# Patient Record
Sex: Female | Born: 1945 | Race: White | Hispanic: No | Smoking: Former smoker
Health system: Southern US, Community
[De-identification: ages and names within clinical notes are randomized; demographics above are authoritative.]

## PROBLEM LIST (undated history)

## (undated) DIAGNOSIS — I219 Acute myocardial infarction, unspecified: Secondary | ICD-10-CM

## (undated) DIAGNOSIS — C801 Malignant (primary) neoplasm, unspecified: Secondary | ICD-10-CM

## (undated) DIAGNOSIS — K219 Gastro-esophageal reflux disease without esophagitis: Secondary | ICD-10-CM

## (undated) DIAGNOSIS — E039 Hypothyroidism, unspecified: Secondary | ICD-10-CM

## (undated) DIAGNOSIS — E785 Hyperlipidemia, unspecified: Secondary | ICD-10-CM

## (undated) DIAGNOSIS — I10 Essential (primary) hypertension: Secondary | ICD-10-CM

## (undated) HISTORY — PX: CORONARY ANGIOPLASTY WITH STENT PLACEMENT: SHX49

## (undated) HISTORY — PX: ABDOMINAL HYSTERECTOMY: SHX81

---

## 2004-11-11 ENCOUNTER — Ambulatory Visit: Payer: Self-pay | Admitting: Family Medicine

## 2005-12-27 ENCOUNTER — Ambulatory Visit: Payer: Self-pay | Admitting: Family Medicine

## 2006-04-10 ENCOUNTER — Emergency Department: Payer: Self-pay | Admitting: General Practice

## 2006-08-04 ENCOUNTER — Ambulatory Visit: Payer: Self-pay | Admitting: Gastroenterology

## 2007-03-13 ENCOUNTER — Ambulatory Visit: Payer: Self-pay | Admitting: Family Medicine

## 2008-04-03 ENCOUNTER — Ambulatory Visit: Payer: Self-pay | Admitting: Family Medicine

## 2010-05-12 ENCOUNTER — Ambulatory Visit: Payer: Self-pay | Admitting: Family Medicine

## 2010-06-07 ENCOUNTER — Emergency Department: Payer: Self-pay | Admitting: Emergency Medicine

## 2010-06-10 ENCOUNTER — Emergency Department: Payer: Self-pay | Admitting: Emergency Medicine

## 2011-05-18 ENCOUNTER — Ambulatory Visit: Payer: Self-pay | Admitting: Family Medicine

## 2011-09-13 ENCOUNTER — Ambulatory Visit: Payer: Self-pay | Admitting: Gastroenterology

## 2012-08-15 ENCOUNTER — Ambulatory Visit: Payer: Self-pay | Admitting: Family Medicine

## 2013-08-16 ENCOUNTER — Ambulatory Visit: Payer: Self-pay | Admitting: Family Medicine

## 2013-08-23 ENCOUNTER — Ambulatory Visit: Payer: Self-pay | Admitting: Family Medicine

## 2014-09-10 ENCOUNTER — Ambulatory Visit: Payer: Self-pay | Admitting: Family Medicine

## 2015-07-28 ENCOUNTER — Encounter: Payer: Self-pay | Admitting: Emergency Medicine

## 2015-07-28 ENCOUNTER — Emergency Department: Payer: Medicare Other

## 2015-07-28 ENCOUNTER — Other Ambulatory Visit: Payer: Self-pay

## 2015-07-28 ENCOUNTER — Emergency Department
Admission: EM | Admit: 2015-07-28 | Discharge: 2015-07-28 | Disposition: A | Discharge status: Other Acute Inpatient Hospital | Payer: Medicare Other | Attending: Emergency Medicine | Admitting: Emergency Medicine

## 2015-07-28 DIAGNOSIS — Z87891 Personal history of nicotine dependence: Secondary | ICD-10-CM | POA: Insufficient documentation

## 2015-07-28 DIAGNOSIS — I10 Essential (primary) hypertension: Secondary | ICD-10-CM | POA: Diagnosis not present

## 2015-07-28 DIAGNOSIS — Z79899 Other long term (current) drug therapy: Secondary | ICD-10-CM | POA: Insufficient documentation

## 2015-07-28 DIAGNOSIS — I2119 ST elevation (STEMI) myocardial infarction involving other coronary artery of inferior wall: Secondary | ICD-10-CM

## 2015-07-28 DIAGNOSIS — R079 Chest pain, unspecified: Secondary | ICD-10-CM | POA: Diagnosis present

## 2015-07-28 DIAGNOSIS — I221 Subsequent ST elevation (STEMI) myocardial infarction of inferior wall: Secondary | ICD-10-CM | POA: Diagnosis not present

## 2015-07-28 DIAGNOSIS — I213 ST elevation (STEMI) myocardial infarction of unspecified site: Secondary | ICD-10-CM

## 2015-07-28 LAB — CBC
HCT: 37.4 % (ref 35.0–47.0)
HEMOGLOBIN: 12.1 g/dL (ref 12.0–16.0)
MCH: 27.7 pg (ref 26.0–34.0)
MCHC: 32.5 g/dL (ref 32.0–36.0)
MCV: 85.4 fL (ref 80.0–100.0)
Platelets: 248 10*3/uL (ref 150–440)
RBC: 4.38 MIL/uL (ref 3.80–5.20)
RDW: 14 % (ref 11.5–14.5)
WBC: 7.4 10*3/uL (ref 3.6–11.0)

## 2015-07-28 LAB — COMPREHENSIVE METABOLIC PANEL
ALBUMIN: 3.8 g/dL (ref 3.5–5.0)
ALK PHOS: 67 U/L (ref 38–126)
ALT: 18 U/L (ref 14–54)
ANION GAP: 5 (ref 5–15)
AST: 26 U/L (ref 15–41)
BUN: 26 mg/dL — ABNORMAL HIGH (ref 6–20)
CALCIUM: 8.8 mg/dL — AB (ref 8.9–10.3)
CO2: 25 mmol/L (ref 22–32)
CREATININE: 1.12 mg/dL — AB (ref 0.44–1.00)
Chloride: 109 mmol/L (ref 101–111)
GFR calc Af Amer: 57 mL/min — ABNORMAL LOW (ref >60)
GFR calc non Af Amer: 49 mL/min — ABNORMAL LOW (ref >60)
GLUCOSE: 153 mg/dL — AB (ref 65–99)
Potassium: 3.3 mmol/L — ABNORMAL LOW (ref 3.5–5.1)
SODIUM: 139 mmol/L (ref 135–145)
Total Bilirubin: 0.4 mg/dL (ref 0.3–1.2)
Total Protein: 6.6 g/dL (ref 6.5–8.1)

## 2015-07-28 LAB — TROPONIN I: Troponin I: 0.03 ng/mL (ref <0.031)

## 2015-07-28 MED ORDER — HEPARIN SODIUM (PORCINE) 5000 UNIT/ML IJ SOLN
4000.0000 [IU] | Freq: Once | INTRAMUSCULAR | Status: AC
  Administered 2015-07-28: 4000 [IU] via INTRAVENOUS
  Filled 2015-07-28: qty 1

## 2015-07-28 MED ORDER — CLOPIDOGREL BISULFATE 75 MG PO TABS
600.0000 mg | ORAL_TABLET | Freq: Once | ORAL | Status: AC
  Administered 2015-07-28: 600 mg via ORAL
  Filled 2015-07-28: qty 8

## 2015-07-28 MED ORDER — MORPHINE SULFATE (PF) 2 MG/ML IV SOLN
2.0000 mg | Freq: Once | INTRAVENOUS | Status: AC
  Administered 2015-07-28: 2 mg via INTRAVENOUS
  Filled 2015-07-28: qty 1

## 2015-07-28 MED ORDER — GI COCKTAIL ~~LOC~~
ORAL | Status: AC
  Administered 2015-07-28: 30 mL
  Filled 2015-07-28: qty 30

## 2015-07-28 NOTE — ED Provider Notes (Addendum)
[facility] Emergency Department Provider Note  ____________________________________________  Time seen: Approximately 2312 PM  I have reviewed the triage vital signs and the nursing notes.   HISTORY  Chief Complaint Gastrophageal Reflux    HPI Alison Lyons is a 69 y.o. female who comes in with some chest pressure and throat burning. The patient reports that she was with her brother and the pain started around 9 PM. The patient reports that she took 2 calcium pills and then took some Tums. The patient reports she was sitting in her chair when the pain started. She reports that she receive some aspirin on her way here by EMS. The patient called her daughter and then ambulance. The patient reports that she feels heavy in her chest with some shortness of breath but denies nausea vomiting or dizziness. The patient also was not sweaty. The patient reports that the burning in her throat is very uncomfortable.   Past medical history Hypertension Hypothyroid Hyperlipidemia GERD  There are no active problems to display for this patient.   Past surgical history Partial hysterectomy  Current Outpatient Rx  Name  Route  Sig  Dispense  Refill  . amLODipine (NORVASC) 10 MG tablet   Oral   Take 1 tablet by mouth daily.         . Calcium Carbonate-Vitamin D 600-400 MG-UNIT per tablet   Oral   Take 1 tablet by mouth daily.         Marland Kitchen levothyroxine (SYNTHROID, LEVOTHROID) 75 MCG tablet   Oral   Take 1 tablet by mouth every morning. Take 1 tablet every morning on an empty stomach with water 30-60 minutes before breakfast.         . lovastatin (MEVACOR) 40 MG tablet   Oral   Take 2 tablets by mouth daily.         Marland Kitchen omeprazole (PRILOSEC) 20 MG capsule   Oral   Take 1 capsule by mouth daily.           Allergies Lisinopril  History reviewed. No pertinent family history.  Social History Social History  Substance Use Topics  . Smoking status:  Former Research scientist (life sciences)   . Smokeless tobacco: None  . Alcohol Use: No    Review of Systems Constitutional: No fever/chills Eyes: No visual changes. ENT: throat burning Cardiovascular:  chest pain. Respiratory:  shortness of breath. Gastrointestinal: No abdominal pain.  No nausea, no vomiting.  No diarrhea.  No constipation. Genitourinary: Negative for dysuria. Musculoskeletal: Negative for back pain. Skin: Negative for rash. Neurological: Negative for headaches, focal weakness or numbness.  10-point ROS otherwise negative.  ____________________________________________   PHYSICAL EXAM:  VITAL SIGNS: ED Triage Vitals  Enc Vitals Group     BP 07/28/15 2307 110/63 mmHg     Pulse Rate 07/28/15 2307 52     Resp 07/28/15 2307 18     Temp 07/28/15 2307 97.9 F (36.6 C)     Temp Source 07/28/15 2307 Oral     SpO2 07/28/15 2307 98 %     Weight 07/28/15 2307 150 lb (68.04 kg)     Height 07/28/15 2307 5\' 2"  (1.575 m)     Head Cir --      Peak Flow --      Pain Score 07/28/15 2314 0     Pain Loc --      Pain Edu? --      Excl. in [place]? --     Constitutional: Alert. Well  appearing and in moderate distress. Eyes: Conjunctivae are normal. PERRL. EOMI. Head: Atraumatic. Nose: No congestion/rhinnorhea. Mouth/Throat: Mucous membranes are moist.  Oropharynx non-erythematous. Cardiovascular: Normal rate, regular rhythm. Grossly normal heart sounds.  Good peripheral circulation. Respiratory: Normal respiratory effort.  No retractions. Lungs CTAB. Gastrointestinal: Soft and nontender. No distention. Positive bowel sounds Musculoskeletal: No lower extremity tenderness nor edema.  No joint effusions. Neurologic:  Normal speech and language.  Skin:  Skin is warm, dry and intact. Psychiatric: Mood and affect are normal.   ____________________________________________   LABS (all labs ordered are listed, but only abnormal results are displayed)  Labs Reviewed  CBC  COMPREHENSIVE METABOLIC  PANEL  TROPONIN I   ____________________________________________  EKG  ED ECG REPORT I, Loney Hering, the attending physician, personally viewed and interpreted this ECG.   Date: 07/28/2015  EKG Time: 2308  Rate: 54  Rhythm: sinus bradycardia  Axis: normal  Intervals:none  ST&T Change: St segment depression in leads V1, V2, V3 with minimal elevation in lead II   ED ECG REPORT #2 I, Loney Hering, the attending physician, personally viewed and interpreted this ECG.   Date: 07/28/2015  EKG Time: 2313  Rate: 55  Rhythm: atrial fibrillation, rate 55  Axis: normal  Intervals:none  ST&T Change: ST segment elevation in lead II, III and avf, V4, v5 and V6  ____________________________________________  RADIOLOGY  CXR: pending ____________________________________________   PROCEDURES  Procedure(s) performed: None  Critical Care performed: Yes, see critical care note(s)   CRITICAL CARE Performed by: Charlesetta Ivory P   Total critical care time: 30 minutes  Critical care time was exclusive of separately billable procedures and treating other patients.  Critical care was necessary to treat or prevent imminent or life-threatening deterioration.  Critical care was time spent personally by me on the following activities: development of treatment plan with patient and/or surrogate as well as nursing, discussions with consultants, evaluation of patient's response to treatment, examination of patient, obtaining history from patient or surrogate, ordering and performing treatments and interventions, ordering and review of laboratory studies, ordering and review of radiographic studies, pulse oximetry and re-evaluation of patient's condition.   ____________________________________________   INITIAL IMPRESSION / ASSESSMENT AND PLAN / ED COURSE  Pertinent labs & imaging results that were available during my care of the patient were reviewed by me and considered in  my medical decision making (see chart for details).  This is a 69 year old female who comes in with some chest pressure and throat burning. The initial EKG showed some ST depressions in leads V2 and V3 with some minimal less than 1 mm elevation in lead 2 but not in any contiguous leads. I did have the nurse repeat the EKG and it then showed ST segment elevation in leads 2, 3 and aVF as well as the continued depressions in leads V2 and V3. At that time a STEMI was called and the patient was given a dose of heparin as well as Plavix. We attempted to get right-sided heart leads given the location of the ST depression to ensure that she was not having an inferior wall MI to infer if we could give nitroglycerin. EMS arrived prior to obtaining the right-sided heart leads so the patient was given morphine instead. I spoke with Dr. Felicie Morn the cardiologist at [facility] who accepted the patient and did not request ticagrilor or Integrilin. The patient will be transferred to [facility] cone for an acute ST segment elevation MI. The patient  also received a GI cocktail for her throat burning. ____________________________________________   FINAL CLINICAL IMPRESSION(S) / ED DIAGNOSES  Final diagnoses:  STEMI (ST elevation myocardial infarction)      Loney Hering, MD 07/28/15 2344  Loney Hering, MD 07/28/15 2348

## 2015-07-28 NOTE — ED Notes (Signed)
Pt arrived to the ED via EMS for complaints of having throat burning and irritation. Pt reports that she has a history of "acid reflux." Pt is AOx4 in no apparent distress.

## 2015-07-28 NOTE — ED Notes (Signed)
Pt left with [facility] to [facility].

## 2015-07-29 ENCOUNTER — Encounter (HOSPITAL_COMMUNITY): Payer: Self-pay | Admitting: Cardiology

## 2015-07-29 ENCOUNTER — Inpatient Hospital Stay (HOSPITAL_COMMUNITY)
Admission: AD | Admit: 2015-07-29 | Discharge: 2015-07-31 | DRG: 247 | Disposition: A | Discharge status: Home / Self Care | Payer: Medicare Other | Source: Other Acute Inpatient Hospital | Attending: Cardiology | Admitting: Cardiology

## 2015-07-29 ENCOUNTER — Encounter (HOSPITAL_COMMUNITY)
Admission: AD | Disposition: A | Discharge status: Home / Self Care | Payer: Self-pay | Source: Other Acute Inpatient Hospital | Attending: Cardiology

## 2015-07-29 DIAGNOSIS — I214 Non-ST elevation (NSTEMI) myocardial infarction: Secondary | ICD-10-CM | POA: Diagnosis present

## 2015-07-29 DIAGNOSIS — E785 Hyperlipidemia, unspecified: Secondary | ICD-10-CM | POA: Diagnosis present

## 2015-07-29 DIAGNOSIS — I10 Essential (primary) hypertension: Secondary | ICD-10-CM | POA: Diagnosis present

## 2015-07-29 DIAGNOSIS — K219 Gastro-esophageal reflux disease without esophagitis: Secondary | ICD-10-CM | POA: Diagnosis present

## 2015-07-29 DIAGNOSIS — Z6828 Body mass index (BMI) 28.0-28.9, adult: Secondary | ICD-10-CM | POA: Diagnosis not present

## 2015-07-29 DIAGNOSIS — Z79899 Other long term (current) drug therapy: Secondary | ICD-10-CM | POA: Diagnosis not present

## 2015-07-29 DIAGNOSIS — Z23 Encounter for immunization: Secondary | ICD-10-CM | POA: Diagnosis not present

## 2015-07-29 DIAGNOSIS — R079 Chest pain, unspecified: Secondary | ICD-10-CM | POA: Diagnosis present

## 2015-07-29 DIAGNOSIS — E039 Hypothyroidism, unspecified: Secondary | ICD-10-CM | POA: Diagnosis not present

## 2015-07-29 DIAGNOSIS — I2119 ST elevation (STEMI) myocardial infarction involving other coronary artery of inferior wall: Secondary | ICD-10-CM | POA: Diagnosis present

## 2015-07-29 DIAGNOSIS — Z888 Allergy status to other drugs, medicaments and biological substances status: Secondary | ICD-10-CM | POA: Diagnosis not present

## 2015-07-29 HISTORY — DX: Hyperlipidemia, unspecified: E78.5

## 2015-07-29 HISTORY — DX: Gastro-esophageal reflux disease without esophagitis: K21.9

## 2015-07-29 HISTORY — DX: Hypothyroidism, unspecified: E03.9

## 2015-07-29 HISTORY — DX: Essential (primary) hypertension: I10

## 2015-07-29 HISTORY — PX: CARDIAC CATHETERIZATION: SHX172

## 2015-07-29 LAB — COMPREHENSIVE METABOLIC PANEL
ALK PHOS: 64 U/L (ref 38–126)
ALT: 16 U/L (ref 14–54)
AST: 23 U/L (ref 15–41)
Albumin: 3.1 g/dL — ABNORMAL LOW (ref 3.5–5.0)
Anion gap: 5 (ref 5–15)
BILIRUBIN TOTAL: 0.2 mg/dL — AB (ref 0.3–1.2)
BUN: 18 mg/dL (ref 6–20)
CALCIUM: 8.4 mg/dL — AB (ref 8.9–10.3)
CO2: 22 mmol/L (ref 22–32)
CREATININE: 0.79 mg/dL (ref 0.44–1.00)
Chloride: 113 mmol/L — ABNORMAL HIGH (ref 101–111)
GFR calc Af Amer: >60 mL/min (ref >60)
Glucose, Bld: 125 mg/dL — ABNORMAL HIGH (ref 65–99)
Potassium: 3.8 mmol/L (ref 3.5–5.1)
Sodium: 140 mmol/L (ref 135–145)
TOTAL PROTEIN: 5.7 g/dL — AB (ref 6.5–8.1)

## 2015-07-29 LAB — BASIC METABOLIC PANEL
ANION GAP: 7 (ref 5–15)
BUN: 16 mg/dL (ref 6–20)
CALCIUM: 8.6 mg/dL — AB (ref 8.9–10.3)
CO2: 22 mmol/L (ref 22–32)
Chloride: 111 mmol/L (ref 101–111)
Creatinine, Ser: 0.78 mg/dL (ref 0.44–1.00)
GFR calc Af Amer: >60 mL/min (ref >60)
GFR calc non Af Amer: >60 mL/min (ref >60)
GLUCOSE: 95 mg/dL (ref 65–99)
Potassium: 3.9 mmol/L (ref 3.5–5.1)
Sodium: 140 mmol/L (ref 135–145)

## 2015-07-29 LAB — CBC WITH DIFFERENTIAL/PLATELET
BASOS ABS: 0 10*3/uL (ref 0.0–0.1)
Basophils Relative: 0 % (ref 0–1)
Eosinophils Absolute: 0 10*3/uL (ref 0.0–0.7)
Eosinophils Relative: 0 % (ref 0–5)
HEMATOCRIT: 33.5 % — AB (ref 36.0–46.0)
Hemoglobin: 10.7 g/dL — ABNORMAL LOW (ref 12.0–15.0)
LYMPHS ABS: 0.9 10*3/uL (ref 0.7–4.0)
LYMPHS PCT: 17 % (ref 12–46)
MCH: 27.4 pg (ref 26.0–34.0)
MCHC: 31.9 g/dL (ref 30.0–36.0)
MCV: 85.9 fL (ref 78.0–100.0)
MONO ABS: 0.3 10*3/uL (ref 0.1–1.0)
MONOS PCT: 5 % (ref 3–12)
NEUTROS ABS: 4.1 10*3/uL (ref 1.7–7.7)
Neutrophils Relative %: 78 % — ABNORMAL HIGH (ref 43–77)
Platelets: 242 10*3/uL (ref 150–400)
RBC: 3.9 MIL/uL (ref 3.87–5.11)
RDW: 14.2 % (ref 11.5–15.5)
WBC: 5.3 10*3/uL (ref 4.0–10.5)

## 2015-07-29 LAB — TSH: TSH: 0.648 u[IU]/mL (ref 0.350–4.500)

## 2015-07-29 LAB — TROPONIN I
TROPONIN I: 1.63 ng/mL — AB (ref <0.031)
Troponin I: 1.28 ng/mL (ref <0.031)
Troponin I: 1.82 ng/mL (ref <0.031)

## 2015-07-29 LAB — CBC
HEMATOCRIT: 35.5 % — AB (ref 36.0–46.0)
HEMOGLOBIN: 11.4 g/dL — AB (ref 12.0–15.0)
MCH: 27.8 pg (ref 26.0–34.0)
MCHC: 32.1 g/dL (ref 30.0–36.0)
MCV: 86.6 fL (ref 78.0–100.0)
Platelets: 237 10*3/uL (ref 150–400)
RBC: 4.1 MIL/uL (ref 3.87–5.11)
RDW: 14.2 % (ref 11.5–15.5)
WBC: 5.5 10*3/uL (ref 4.0–10.5)

## 2015-07-29 LAB — LIPID PANEL
CHOL/HDL RATIO: 3.4 ratio
Cholesterol: 154 mg/dL (ref 0–200)
HDL: 45 mg/dL (ref >40)
LDL Cholesterol: 81 mg/dL (ref 0–99)
TRIGLYCERIDES: 139 mg/dL (ref <150)
VLDL: 28 mg/dL (ref 0–40)

## 2015-07-29 LAB — GLUCOSE, CAPILLARY
GLUCOSE-CAPILLARY: 87 mg/dL (ref 65–99)
GLUCOSE-CAPILLARY: 89 mg/dL (ref 65–99)
Glucose-Capillary: 82 mg/dL (ref 65–99)
Glucose-Capillary: 100 mg/dL — ABNORMAL HIGH (ref 65–99)

## 2015-07-29 LAB — MRSA PCR SCREENING: MRSA by PCR: NEGATIVE

## 2015-07-29 LAB — MAGNESIUM: Magnesium: 2.2 mg/dL (ref 1.7–2.4)

## 2015-07-29 LAB — POCT ACTIVATED CLOTTING TIME: ACTIVATED CLOTTING TIME: 374 s

## 2015-07-29 SURGERY — LEFT HEART CATH AND CORONARY ANGIOGRAPHY
Anesthesia: LOCAL

## 2015-07-29 MED ORDER — ACETAMINOPHEN 325 MG PO TABS: 650.0000 mg | ORAL_TABLET | ORAL | Status: DC | PRN

## 2015-07-29 MED ORDER — SODIUM CHLORIDE 0.9 % IV SOLN: 250.0000 mL | INTRAVENOUS | Status: DC | PRN

## 2015-07-29 MED ORDER — ATROPINE SULFATE 0.1 MG/ML IJ SOLN
INTRAMUSCULAR | Status: AC
  Filled 2015-07-29: qty 10

## 2015-07-29 MED ORDER — TIROFIBAN HCL IV 5 MG/100ML
INTRAVENOUS | Status: DC | PRN
  Administered 2015-07-29: 0.075 ug/kg/min via INTRAVENOUS

## 2015-07-29 MED ORDER — FENTANYL CITRATE (PF) 100 MCG/2ML IJ SOLN
INTRAMUSCULAR | Status: DC | PRN
  Administered 2015-07-29: 25 ug via INTRAVENOUS

## 2015-07-29 MED ORDER — ASPIRIN 81 MG PO CHEW: 81.0000 mg | CHEWABLE_TABLET | Freq: Every day | ORAL | Status: DC

## 2015-07-29 MED ORDER — POTASSIUM CHLORIDE CRYS ER 20 MEQ PO TBCR
40.0000 meq | EXTENDED_RELEASE_TABLET | Freq: Once | ORAL | Status: AC
  Administered 2015-07-29: 40 meq via ORAL
  Filled 2015-07-29: qty 2

## 2015-07-29 MED ORDER — INSULIN ASPART 100 UNIT/ML ~~LOC~~ SOLN: 0.0000 [IU] | Freq: Three times a day (TID) | SUBCUTANEOUS | Status: DC

## 2015-07-29 MED ORDER — ASPIRIN EC 81 MG PO TBEC
81.0000 mg | DELAYED_RELEASE_TABLET | Freq: Every day | ORAL | Status: DC
  Administered 2015-07-30 – 2015-07-31 (×2): 81 mg via ORAL
  Filled 2015-07-29 (×2): qty 1

## 2015-07-29 MED ORDER — SODIUM CHLORIDE 0.9 % IJ SOLN
3.0000 mL | Freq: Two times a day (BID) | INTRAMUSCULAR | Status: DC
  Administered 2015-07-29 – 2015-07-30 (×4): 3 mL via INTRAVENOUS

## 2015-07-29 MED ORDER — PANTOPRAZOLE SODIUM 40 MG PO TBEC
40.0000 mg | DELAYED_RELEASE_TABLET | Freq: Every day | ORAL | Status: DC
  Administered 2015-07-29 – 2015-07-31 (×3): 40 mg via ORAL
  Filled 2015-07-29 (×3): qty 1

## 2015-07-29 MED ORDER — MIDAZOLAM HCL 2 MG/2ML IJ SOLN
INTRAMUSCULAR | Status: DC | PRN
  Administered 2015-07-29: 1 mg via INTRAVENOUS

## 2015-07-29 MED ORDER — OXYCODONE-ACETAMINOPHEN 5-325 MG PO TABS: 1.0000 | ORAL_TABLET | ORAL | Status: DC | PRN

## 2015-07-29 MED ORDER — SODIUM CHLORIDE 0.9 % IV SOLN
INTRAVENOUS | Status: AC
  Administered 2015-07-29: 02:00:00 via INTRAVENOUS

## 2015-07-29 MED ORDER — ASPIRIN 81 MG PO CHEW: 324.0000 mg | CHEWABLE_TABLET | ORAL | Status: AC

## 2015-07-29 MED ORDER — SODIUM CHLORIDE 0.9 % IV SOLN
INTRAVENOUS | Status: DC | PRN
  Administered 2015-07-29: 999 mL/h via INTRAVENOUS

## 2015-07-29 MED ORDER — METOPROLOL TARTRATE 12.5 MG HALF TABLET
12.5000 mg | ORAL_TABLET | Freq: Two times a day (BID) | ORAL | Status: DC
  Administered 2015-07-29 – 2015-07-30 (×4): 12.5 mg via ORAL
  Filled 2015-07-29 (×5): qty 1

## 2015-07-29 MED ORDER — SODIUM CHLORIDE 0.9 % IJ SOLN: 3.0000 mL | INTRAMUSCULAR | Status: DC | PRN

## 2015-07-29 MED ORDER — NITROGLYCERIN 1 MG/10 ML FOR IR/CATH LAB
INTRA_ARTERIAL | Status: DC | PRN
  Administered 2015-07-29: 150 ug via INTRACORONARY

## 2015-07-29 MED ORDER — ATORVASTATIN CALCIUM 80 MG PO TABS
80.0000 mg | ORAL_TABLET | Freq: Every day | ORAL | Status: DC
  Administered 2015-07-29 – 2015-07-30 (×2): 80 mg via ORAL
  Filled 2015-07-29 (×2): qty 1

## 2015-07-29 MED ORDER — BIVALIRUDIN 250 MG IV SOLR
250.0000 mg | INTRAVENOUS | Status: DC | PRN
  Administered 2015-07-29: 1.75 mg/kg/h via INTRAVENOUS

## 2015-07-29 MED ORDER — TIROFIBAN HCL IV 12.5 MG/250 ML
0.0750 ug/kg/min | INTRAVENOUS | Status: AC
  Administered 2015-07-29: 0.075 ug/kg/min via INTRAVENOUS

## 2015-07-29 MED ORDER — PNEUMOCOCCAL VAC POLYVALENT 25 MCG/0.5ML IJ INJ
0.5000 mL | INJECTION | INTRAMUSCULAR | Status: AC
  Administered 2015-07-30: 0.5 mL via INTRAMUSCULAR
  Filled 2015-07-29: qty 0.5

## 2015-07-29 MED ORDER — ASPIRIN 300 MG RE SUPP: 300.0000 mg | RECTAL | Status: AC

## 2015-07-29 MED ORDER — CLOPIDOGREL BISULFATE 75 MG PO TABS
75.0000 mg | ORAL_TABLET | Freq: Every day | ORAL | Status: DC
  Administered 2015-07-29 – 2015-07-31 (×3): 75 mg via ORAL
  Filled 2015-07-29 (×4): qty 1

## 2015-07-29 MED ORDER — IOHEXOL 350 MG/ML SOLN
INTRAVENOUS | Status: DC | PRN
  Administered 2015-07-29: 135 mL via INTRACARDIAC

## 2015-07-29 MED ORDER — ONDANSETRON HCL 4 MG/2ML IJ SOLN: 4.0000 mg | Freq: Four times a day (QID) | INTRAMUSCULAR | Status: DC | PRN

## 2015-07-29 MED ORDER — LIDOCAINE HCL (PF) 1 % IJ SOLN
INTRAMUSCULAR | Status: DC | PRN
  Administered 2015-07-29: 30 mL via SUBCUTANEOUS

## 2015-07-29 MED ORDER — CLOPIDOGREL BISULFATE 75 MG PO TABS: 75.0000 mg | ORAL_TABLET | Freq: Every day | ORAL | Status: DC

## 2015-07-29 MED ORDER — LIDOCAINE HCL (PF) 1 % IJ SOLN
INTRAMUSCULAR | Status: DC | PRN
  Administered 2015-07-29: 01:00:00

## 2015-07-29 MED ORDER — NITROGLYCERIN 0.4 MG SL SUBL: 0.4000 mg | SUBLINGUAL_TABLET | SUBLINGUAL | Status: DC | PRN

## 2015-07-29 MED ORDER — NITROGLYCERIN IN D5W 200-5 MCG/ML-% IV SOLN
5.0000 ug/min | INTRAVENOUS | Status: DC
  Administered 2015-07-29: 5 ug/min via INTRAVENOUS
  Filled 2015-07-29: qty 250

## 2015-07-29 MED ORDER — BIVALIRUDIN BOLUS VIA INFUSION - CUPID
INTRAVENOUS | Status: DC | PRN
  Administered 2015-07-29: 51 mg via INTRAVENOUS

## 2015-07-29 MED ORDER — TIROFIBAN (AGGRASTAT) BOLUS VIA INFUSION
INTRAVENOUS | Status: DC | PRN
  Administered 2015-07-29: 1700 ug via INTRAVENOUS

## 2015-07-29 SURGICAL SUPPLY — 16 items
BALLN EMERGE MR 2.5X12 (BALLOONS) ×2
BALLN ~~LOC~~ EMERGE MR 3.25X12 (BALLOONS) ×2
BALLOON EMERGE MR 2.5X12 (BALLOONS) ×1 IMPLANT
BALLOON ~~LOC~~ EMERGE MR 3.25X12 (BALLOONS) ×1 IMPLANT
CATH INFINITI 5FR MULTPACK ANG (CATHETERS) ×2 IMPLANT
GUIDE CATH RUNWAY 6FR VL3.5 (CATHETERS) ×2 IMPLANT
KIT ENCORE 26 ADVANTAGE (KITS) ×2 IMPLANT
KIT HEART LEFT (KITS) ×2 IMPLANT
PACK CARDIAC CATHETERIZATION (CUSTOM PROCEDURE TRAY) ×2 IMPLANT
SHEATH PINNACLE 6F 10CM (SHEATH) ×2 IMPLANT
STENT XIENCE ALPINE RX 3.0X15 (Permanent Stent) ×2 IMPLANT
SYR MEDRAD MARK V 150ML (SYRINGE) ×2 IMPLANT
TRANSDUCER W/STOPCOCK (MISCELLANEOUS) ×2 IMPLANT
TUBING CIL FLEX 10 FLL-RA (TUBING) ×2 IMPLANT
WIRE EMERALD 3MM-J .035X150CM (WIRE) ×2 IMPLANT
WIRE RUNTHROUGH .014X180CM (WIRE) ×2 IMPLANT

## 2015-07-29 NOTE — Progress Notes (Signed)
Subjective:  Doing well denies any chest pain or shortness of breath. Underwent PCI to left circumflex earlier this morning tolerated the procedure well  Objective:  Vital Signs in the last 24 hours: Temp:  [97.9 F (36.6 C)-98.2 F (36.8 C)] 98 F (36.7 C) (08/24 0742) Pulse Rate:  [0-79] 45 (08/24 1052) Resp:  [0-77] 15 (08/24 1052) BP: (84-155)/(49-100) 111/66 mmHg (08/24 1052) SpO2:  [0 %-100 %] 100 % (08/24 1052) Arterial Line BP: (118-157)/(68-86) 118/70 mmHg (08/24 0400) Weight:  [68.04 kg (150 lb)-73.5 kg (162 lb 0.6 oz)] 73.5 kg (162 lb 0.6 oz) (08/24 0339)  Intake/Output from previous day: 08/23 0701 - 08/24 0700 In: 704.7 [I.V.:704.7] Out: 600 [Urine:600] Intake/Output from this shift: Total I/O In: 22.8 [I.V.:22.8] Out: -   Physical Exam: Neck: no adenopathy, no carotid bruit, no JVD and supple, symmetrical, trachea midline Lungs: clear to auscultation bilaterally Heart: regular rate and rhythm, S1, S2 normal and Soft systolic murmur noted no S3 gallop Abdomen: soft, non-tender; bowel sounds normal; no masses,  no organomegaly Extremities: extremities normal, atraumatic, no cyanosis or edema and Right groin dressing dry  Lab Results:  Recent Labs  07/29/15 0445 07/29/15 0738  WBC 5.3 5.5  HGB 10.7* 11.4*  PLT 242 237    Recent Labs  07/29/15 0445 07/29/15 0738  NA 140 140  K 3.8 3.9  CL 113* 111  CO2 22 22  GLUCOSE 125* 95  BUN 18 16  CREATININE 0.79 0.78    Recent Labs  07/29/15 0445 07/29/15 0738  TROPONINI 1.28* 1.82*   Hepatic Function Panel  Recent Labs  07/29/15 0445  PROT 5.7*  ALBUMIN 3.1*  AST 23  ALT 16  ALKPHOS 64  BILITOT 0.2*    Recent Labs  07/29/15 0445  CHOL 154   No results for input(s): PROTIME in the last 72 hours.  Imaging: Imaging results have been reviewed and Dg Chest Port 1 View  07/28/2015   CLINICAL DATA:  Throat burning and irritation with Mid chest pain.  EXAM: PORTABLE CHEST - 1 VIEW   COMPARISON:  None.  FINDINGS: Cardiac enlargement without vascular congestion. No focal airspace disease or consolidation in the lungs. No blunting of costophrenic angles. No pneumothorax. Mediastinal contours appear intact.  IMPRESSION: Cardiac enlargement.  No evidence of active pulmonary disease.   Electronically Signed   By: Lucienne Capers M.D.   On: 07/28/2015 23:45    Cardiac Studies:  Assessment/Plan:  Acute inferoposterolateral wall MI status post left cath/PTCA stenting to left circumflex with excellent angiographic results Hypertension Hyperlipidemia lipidemia Morbid obesity Hypothyroidism GERD Elevated blood sugar rule out diabetes Plan Check labs in a.m. Phase I cardiac rehabilitation   LOS: 0 days    Charolette Forward 07/29/2015, 11:28 AM

## 2015-07-29 NOTE — Progress Notes (Signed)
CRITICAL VALUE ALERT  Critical value received:  Troponin 1.28  Date of notification:  07/29/15  Time of notification:  0653  Critical value read back:Yes.    Nurse who received alert:  [place], RN  MD notified (1st page):  Dr. Terrence Dupont  Time of first page:  [phone]  Responding MD:  Dr. Terrence Dupont  Time MD responded:  [phone]  No new orders given

## 2015-07-29 NOTE — Progress Notes (Signed)
CARDIAC REHAB PHASE I   PRE:  Rate/Rhythm: 97 SB  BP:  Supine:   Sitting: 114/79  Standing:    SaO2: 100%RA  MODE:  Ambulation: 700 ft   POST:  Rate/Rhythm: 74 SR  BP:  Supine:   Sitting: 126/72  Standing:    SaO2: `100%RA 1330-1430 Pt walked 700 ft with steady gait. Tolerated well. No CP. Began MI ed. Needs diet and Phase 2. Reviewed importance of plavix with stent. Pt stated likes to walk and is used to walking 2 miles at a time. Gave walking instructions to get back to exercising. Will follow up tomorrow to finish ed. In recliner.   Graylon Good, RN BSN  07/29/2015 2:28 PM

## 2015-07-29 NOTE — Care Management Note (Signed)
Case Management Note  Patient Details  Name: Alison Lyons MRN: [id number] Date of Birth: September 17, 1946  Subjective/Objective:     Adm w mi               Action/Plan:lives  At home   Expected Discharge Date:                  Expected Discharge Plan:     In-House Referral:     Discharge planning Services     Post Acute Care Choice:    Choice offered to:     DME Arranged:    DME Agency:     HH Arranged:    [place] Agency:     Status of Service:     Medicare Important Message Given:    Date Medicare IM Given:    Medicare IM give by:    Date Additional Medicare IM Given:    Additional Medicare Important Message give by:     If discussed at [place] of Stay Meetings, dates discussed:    Additional Comments: ur review done  Lacretia Leigh, RN 07/29/2015, 9:06 AM

## 2015-07-29 NOTE — H&P (Signed)
Alison Lyons is an 69 y.o. female.   Chief Complaint: Retrosternal chest pain associated with burning in the throat HPI: Asian is 69 year old female with past medical history significant for hypertension, hyperlipidemia, hypothyroidism, morbid obesity, GERD, was transferred from [facility] as code STEMI was called. Patient complained of retrosternal chest pain associated with burning in the throat 2 times without relief so decided to go to the ED EKG done in the ED showed sinus bradycardia with ST elevation in inferolateral leads and ST depression in lead V1 to V3 and minimal respiratory changes in lead 1 and aVL suggestive of acute infero-lateral posterior wall MI. Patient received the aspirin IV heparin and 600 of Lasix and was transferred emergently to [facility] for emergency PCI. Patient denies such episodes of chest pain in the past. Denies any history of exertional chest pain. Denies shortness of breath. Denies PND orthopnea and leg swelling.  No past medical history on file.  No past surgical history on file.  No family history on file. Social History:  reports that she has never smoked. She does not have any smokeless tobacco history on file. She reports that she does not drink alcohol or use illicit drugs.  Allergies:  Allergies  Allergen Reactions  . Lisinopril Other (See Comments)    Medications Prior to Admission  Medication Sig Dispense Refill  . amLODipine (NORVASC) 10 MG tablet Take 1 tablet by mouth daily.    . Calcium Carbonate-Vitamin D 600-400 MG-UNIT per tablet Take 1 tablet by mouth daily.    Marland Kitchen levothyroxine (SYNTHROID, LEVOTHROID) 75 MCG tablet Take 1 tablet by mouth every morning. Take 1 tablet every morning on an empty stomach with water 30-60 minutes before breakfast.    . lovastatin (MEVACOR) 40 MG tablet Take 2 tablets by mouth daily.    Marland Kitchen omeprazole (PRILOSEC) 20 MG capsule Take 1 capsule by mouth daily.      Results for orders  placed or performed during the hospital encounter of 07/28/15 (from the past 48 hour(s))  CBC     Status: None   Collection Time: 07/28/15 11:18 PM  Result Value Ref Range   WBC 7.4 3.6 - 11.0 K/uL   RBC 4.38 3.80 - 5.20 MIL/uL   Hemoglobin 12.1 12.0 - 16.0 g/dL   HCT 37.4 35.0 - 47.0 %   MCV 85.4 80.0 - 100.0 fL   MCH 27.7 26.0 - 34.0 pg   MCHC 32.5 32.0 - 36.0 g/dL   RDW 14.0 11.5 - 14.5 %   Platelets 248 150 - 440 K/uL  Comprehensive metabolic panel     Status: Abnormal   Collection Time: 07/28/15 11:18 PM  Result Value Ref Range   Sodium 139 135 - 145 mmol/L   Potassium 3.3 (L) 3.5 - 5.1 mmol/L   Chloride 109 101 - 111 mmol/L   CO2 25 22 - 32 mmol/L   Glucose, Bld 153 (H) 65 - 99 mg/dL   BUN 26 (H) 6 - 20 mg/dL   Creatinine, Ser 1.12 (H) 0.44 - 1.00 mg/dL   Calcium 8.8 (L) 8.9 - 10.3 mg/dL   Total Protein 6.6 6.5 - 8.1 g/dL   Albumin 3.8 3.5 - 5.0 g/dL   AST 26 15 - 41 U/L   ALT 18 14 - 54 U/L   Alkaline Phosphatase 67 38 - 126 U/L   Total Bilirubin 0.4 0.3 - 1.2 mg/dL   GFR calc non Af Amer 49 (L) >60 mL/min   GFR calc  Af Amer 57 (L) >60 mL/min    Comment: (NOTE) The eGFR has been calculated using the CKD EPI equation. This calculation has not been validated in all clinical situations. eGFR's persistently <60 mL/min signify possible Chronic Kidney Disease.    Anion gap 5 5 - 15  Troponin I     Status: None   Collection Time: 07/28/15 11:18 PM  Result Value Ref Range   Troponin I 0.03 <0.031 ng/mL    Comment:        NO INDICATION OF MYOCARDIAL INJURY.    Dg Chest Port 1 View  07/28/2015   CLINICAL DATA:  Throat burning and irritation with Mid chest pain.  EXAM: PORTABLE CHEST - 1 VIEW  COMPARISON:  None.  FINDINGS: Cardiac enlargement without vascular congestion. No focal airspace disease or consolidation in the lungs. No blunting of costophrenic angles. No pneumothorax. Mediastinal contours appear intact.  IMPRESSION: Cardiac enlargement.  No evidence of active  pulmonary disease.   Electronically Signed   By: William  Stevens M.D.   On: 07/28/2015 23:45    Review of Systems  Constitutional: Negative for fever and chills.  Respiratory: Negative for cough, hemoptysis, sputum production and shortness of breath.   Cardiovascular: Positive for chest pain. Negative for palpitations, orthopnea and claudication.  Gastrointestinal: Negative for nausea, vomiting and abdominal pain.  Genitourinary: Negative for dysuria.  Neurological: Negative for dizziness and headaches.    SpO2 100 %. Physical Exam  Constitutional: She is oriented to person, place, and time.  HENT:  Head: Normocephalic and atraumatic.  Eyes: Conjunctivae are normal. Pupils are equal, round, and reactive to light. Left eye exhibits no discharge. No scleral icterus.  Neck: Normal range of motion. Neck supple. No JVD present. No tracheal deviation present. No thyromegaly present.  Cardiovascular: Normal rate and regular rhythm.   Murmur (Soft systolic murmur and S4 gallop noted) heard. Respiratory: Effort normal and breath sounds normal. No respiratory distress. She has no wheezes. She has no rales.  GI: Soft. Bowel sounds are normal. She exhibits no distension. There is no tenderness. There is no rebound.  Musculoskeletal: She exhibits no edema or tenderness.  Neurological: She is alert and oriented to person, place, and time.     Assessment/Plan Acute inferoposterolateral wall MI  Hypertension Hyperlipidemia lipidemia Morbid obesity Hypothyroidism GERD Elevated blood sugar rule out diabetes Plan Discussed with patient briefly in the Cath Lab regarding emergency left cath possible PTCA stenting its risk and benefits i.e. death MI stroke need for emergency CABG local vascular complications etc. and consents for PCI  Harwani, Mohan 07/29/2015, 1:21 AM    

## 2015-07-29 NOTE — Progress Notes (Signed)
Right femoral sheath removed at 0443 by myself, assisted by Thurmond Butts, RN. Pressure was held for 20 minutes, atropine at bedside if needed, VSS, dry pressure dressing applied. No signs of hematoma or bleeding,  pedal pulses +2, site is level 0. Patient educated on site care, will continue to monitor.

## 2015-07-30 DIAGNOSIS — I214 Non-ST elevation (NSTEMI) myocardial infarction: Secondary | ICD-10-CM | POA: Diagnosis not present

## 2015-07-30 LAB — CBC
HCT: 35.7 % — ABNORMAL LOW (ref 36.0–46.0)
Hemoglobin: 11.5 g/dL — ABNORMAL LOW (ref 12.0–15.0)
MCH: 27.8 pg (ref 26.0–34.0)
MCHC: 32.2 g/dL (ref 30.0–36.0)
MCV: 86.2 fL (ref 78.0–100.0)
PLATELETS: 227 10*3/uL (ref 150–400)
RBC: 4.14 MIL/uL (ref 3.87–5.11)
RDW: 14.5 % (ref 11.5–15.5)
WBC: 6.1 10*3/uL (ref 4.0–10.5)

## 2015-07-30 LAB — BASIC METABOLIC PANEL
Anion gap: 7 (ref 5–15)
BUN: 10 mg/dL (ref 6–20)
CO2: 23 mmol/L (ref 22–32)
Calcium: 8.6 mg/dL — ABNORMAL LOW (ref 8.9–10.3)
Chloride: 112 mmol/L — ABNORMAL HIGH (ref 101–111)
Creatinine, Ser: 0.78 mg/dL (ref 0.44–1.00)
GFR calc Af Amer: >60 mL/min (ref >60)
GLUCOSE: 90 mg/dL (ref 65–99)
POTASSIUM: 3.6 mmol/L (ref 3.5–5.1)
Sodium: 142 mmol/L (ref 135–145)

## 2015-07-30 LAB — GLUCOSE, CAPILLARY
GLUCOSE-CAPILLARY: 85 mg/dL (ref 65–99)
GLUCOSE-CAPILLARY: 85 mg/dL (ref 65–99)
Glucose-Capillary: 75 mg/dL (ref 65–99)

## 2015-07-30 LAB — TROPONIN I: Troponin I: 3.19 ng/mL (ref <0.031)

## 2015-07-30 LAB — HEMOGLOBIN A1C
Hgb A1c MFr Bld: 5.8 % — ABNORMAL HIGH (ref 4.8–5.6)
Mean Plasma Glucose: 120 mg/dL

## 2015-07-30 MED ORDER — LEVOTHYROXINE SODIUM 75 MCG PO TABS
75.0000 ug | ORAL_TABLET | Freq: Every day | ORAL | Status: DC
  Administered 2015-07-31: 75 ug via ORAL
  Filled 2015-07-30: qty 1

## 2015-07-30 NOTE — Progress Notes (Signed)
Subjective:  Doing well denies any chest pain or shortness of breath.  Objective:  Vital Signs in the last 24 hours: Temp:  [97.4 F (36.3 C)-98.4 F (36.9 C)] 98.1 F (36.7 C) (08/25 0715) Pulse Rate:  [45-67] 67 (08/25 0715) Resp:  [13-20] 17 (08/25 0715) BP: (85-137)/(50-109) 129/81 mmHg (08/25 0715) SpO2:  [97 %-100 %] 99 % (08/25 0715)  Intake/Output from previous day: 08/24 0701 - 08/25 0700 In: 273.5 [P.O.:240; I.V.:33.5] Out: -  Intake/Output from this shift: Total I/O In: 200 [P.O.:200] Out: -   Physical Exam: Neck: no adenopathy, no carotid bruit, no JVD and supple, symmetrical, trachea midline Lungs: clear to auscultation bilaterally Heart: regular rate and rhythm, S1, S2 normal and Soft systolic murmur noted no S3 gallop Abdomen: soft, non-tender; bowel sounds normal; no masses,  no organomegaly Extremities: extremities normal, atraumatic, no cyanosis or edema  Lab Results:  Recent Labs  07/29/15 0738 07/30/15 0505  WBC 5.5 6.1  HGB 11.4* 11.5*  PLT 237 227    Recent Labs  07/29/15 0738 07/30/15 0505  NA 140 142  K 3.9 3.6  CL 111 112*  CO2 22 23  GLUCOSE 95 90  BUN 16 10  CREATININE 0.78 0.78    Recent Labs  07/29/15 1506 07/30/15 0505  TROPONINI 1.63* 3.19*   Hepatic Function Panel  Recent Labs  07/29/15 0445  PROT 5.7*  ALBUMIN 3.1*  AST 23  ALT 16  ALKPHOS 64  BILITOT 0.2*    Recent Labs  07/29/15 0445  CHOL 154   No results for input(s): PROTIME in the last 72 hours.  Imaging: Imaging results have been reviewed and Dg Chest Port 1 View  07/28/2015   CLINICAL DATA:  Throat burning and irritation with Mid chest pain.  EXAM: PORTABLE CHEST - 1 VIEW  COMPARISON:  None.  FINDINGS: Cardiac enlargement without vascular congestion. No focal airspace disease or consolidation in the lungs. No blunting of costophrenic angles. No pneumothorax. Mediastinal contours appear intact.  IMPRESSION: Cardiac enlargement.  No evidence of  active pulmonary disease.   Electronically Signed   By: Lucienne Capers M.D.   On: 07/28/2015 23:45    Cardiac Studies:  Assessment/Plan:  Acute inferoposterolateral wall MI status post left cath/PTCA stenting to left circumflex with excellent angiographic results Hypertension Hyperlipidemia  Morbid obesity Hypothyroidism GERD Plan Continue present management Check labs in a.m. Transfer to telemetry Possible discharge tomorrow  LOS: 1 day    Charolette Forward 07/30/2015, 10:30 AM

## 2015-07-30 NOTE — Progress Notes (Signed)
CARDIAC REHAB PHASE I   PRE:  Rate/Rhythm: 44 SB  BP:  Supine:   Sitting: 116/61  Standing:    SaO2:   MODE:  Ambulation: 800 ft   POST:  Rate/Rhythm: 71 SR  BP:  Supine:   Sitting: 104/79  Standing:    SaO2:  1040-1114 Pt walked 800 ft with hand held asst with steady gait. No CP. MI education completed. Reviewed heart healthy diet choices. Discussed CRP 2 [place]. Pt did not want referral. Gave brochure for [facility] and encouraged her to discuss with cardiologist.   Graylon Good, RN BSN  07/30/2015 11:10 AM

## 2015-07-30 NOTE — Progress Notes (Signed)
Transferred to [place] room 21 by wheelchair, stable. Report given to RN. Belongings with pt.

## 2015-07-31 DIAGNOSIS — I214 Non-ST elevation (NSTEMI) myocardial infarction: Secondary | ICD-10-CM | POA: Diagnosis not present

## 2015-07-31 LAB — BASIC METABOLIC PANEL
Anion gap: 7 (ref 5–15)
BUN: 14 mg/dL (ref 6–20)
CHLORIDE: 107 mmol/L (ref 101–111)
CO2: 25 mmol/L (ref 22–32)
CREATININE: 0.88 mg/dL (ref 0.44–1.00)
Calcium: 8.4 mg/dL — ABNORMAL LOW (ref 8.9–10.3)
Glucose, Bld: 91 mg/dL (ref 65–99)
Potassium: 3.4 mmol/L — ABNORMAL LOW (ref 3.5–5.1)
SODIUM: 139 mmol/L (ref 135–145)

## 2015-07-31 LAB — CBC
HCT: 35.2 % — ABNORMAL LOW (ref 36.0–46.0)
Hemoglobin: 11.3 g/dL — ABNORMAL LOW (ref 12.0–15.0)
MCH: 27.8 pg (ref 26.0–34.0)
MCHC: 32.1 g/dL (ref 30.0–36.0)
MCV: 86.7 fL (ref 78.0–100.0)
PLATELETS: 215 10*3/uL (ref 150–400)
RBC: 4.06 MIL/uL (ref 3.87–5.11)
RDW: 14.6 % (ref 11.5–15.5)
WBC: 5.2 10*3/uL (ref 4.0–10.5)

## 2015-07-31 LAB — TROPONIN I: TROPONIN I: 2.08 ng/mL — AB (ref <0.031)

## 2015-07-31 LAB — PLATELET INHIBITION P2Y12: PLATELET FUNCTION P2Y12: 197 [PRU] (ref 194–418)

## 2015-07-31 MED ORDER — ATORVASTATIN CALCIUM 40 MG PO TABS: 40.0000 mg | ORAL_TABLET | Freq: Every day | ORAL | Status: AC

## 2015-07-31 MED ORDER — NITROGLYCERIN 0.4 MG SL SUBL: 0.4000 mg | SUBLINGUAL_TABLET | SUBLINGUAL | Status: AC | PRN

## 2015-07-31 MED ORDER — CLOPIDOGREL BISULFATE 75 MG PO TABS: 75.0000 mg | ORAL_TABLET | Freq: Every day | ORAL | Status: AC

## 2015-07-31 MED ORDER — POTASSIUM CHLORIDE CRYS ER 20 MEQ PO TBCR
40.0000 meq | EXTENDED_RELEASE_TABLET | Freq: Once | ORAL | Status: AC
  Administered 2015-07-31: 40 meq via ORAL
  Filled 2015-07-31: qty 2

## 2015-07-31 MED ORDER — METOPROLOL SUCCINATE ER 25 MG PO TB24: 25.0000 mg | ORAL_TABLET | Freq: Every day | ORAL | Status: AC

## 2015-07-31 MED ORDER — ASPIRIN 81 MG PO TBEC: 81.0000 mg | DELAYED_RELEASE_TABLET | Freq: Every day | ORAL | Status: AC

## 2015-07-31 MED ORDER — INFLUENZA VAC SPLIT QUAD 0.5 ML IM SUSY
0.5000 mL | PREFILLED_SYRINGE | Freq: Once | INTRAMUSCULAR | Status: AC
  Administered 2015-07-31: 0.5 mL via INTRAMUSCULAR
  Filled 2015-07-31: qty 0.5

## 2015-07-31 NOTE — Care Management Important Message (Signed)
Important Message  Patient Details  Name: Alison Lyons MRN: [id number] Date of Birth: 05/23/1946   Medicare Important Message Given:  N/A - LOS <3 / Initial given by admissions    Keller Bounds, Antony Haste, RN 07/31/2015, 11:08 AM

## 2015-07-31 NOTE — Discharge Summary (Signed)
Discharge summary dictated on 07/31/2015, dictation number is [phone]

## 2015-07-31 NOTE — Progress Notes (Signed)
CARDIAC REHAB PHASE I   PRE:  Rate/Rhythm: 11 SB  BP:  Supine:   Sitting: 118/68  Standing:    SaO2:   MODE:  Ambulation: 750 ft   POST:  Rate/Rhythm: 66 SR  BP:  Supine:   Sitting: 120/66  Standing:    SaO2:  1100-1125 Pt walked 750 ft with hand held asst . Gait steady. Tolerated well. No CP.   Graylon Good, RN BSN  07/31/2015 11:24 AM

## 2015-07-31 NOTE — Discharge Instructions (Signed)
° °  ° °Acute Coronary Syndrome °Acute coronary syndrome (ACS) is an urgent problem in which the blood and oxygen supply to the heart is critically deficient. ACS requires hospitalization because one or more coronary arteries may be blocked. °ACS represents a range of conditions including: °· Previous angina that is now unstable, lasts longer, happens at rest, or is more intense. °· A heart attack, with heart muscle cell injury and death. °There are three vital coronary arteries that supply the heart muscle with blood and oxygen so that it can pump blood effectively. If blockages to these arteries develop, blood flow to the heart muscle is reduced. If the heart does not get enough blood, angina may occur as the first warning sign. °SYMPTOMS  °· The most common signs of angina include: °· Tightness or squeezing in the chest. °· Feeling of heaviness on the chest. °· Discomfort in the arms, neck, back, or jaw. °· Shortness of breath and nausea. °· Cold, wet skin. °· Angina is usually brought on by physical effort or excitement which increase the oxygen needs of the heart. These states increase the blood flow needs of the heart beyond what can be delivered. °· Other symptoms that are not as common include: °· Fatigue °· Unexplained feelings of nervousness or anxiety °· Weakness °· Diarrhea °· Sometimes, you may not have noticed any symptoms at all but still suffered a cardiac injury. °TREATMENT  °· Medicines to help discomfort may include nitroglycerin (nitro) in the form of tablets or a spray for rapid relief, or longer-acting forms such as cream, patches, or capsules. (Be aware that there are many side effects and possible interactions with other drugs). °· Other medicines may be used to help the heart pump better. °· Procedures to open blocked arteries including angioplasty or stent placement to keep the arteries open. °· Open heart surgery may be needed when there are many blockages or they are in critical  locations that are best treated with surgery. °HOME CARE INSTRUCTIONS  °· Do not use any tobacco products including cigarettes, chewing tobacco, or electronic cigarettes. °· Take one baby or adult aspirin daily, if your health care provider advises. This helps reduce the risk of a heart attack. °· It is very important that you follow the angina treatment prescribed by your health care provider. Make arrangements for proper follow-up care. °· Eat a heart healthy diet with salt and fat restrictions as advised. °· Regular exercise is good for you as long as it does not cause discomfort. Do not begin any new type of exercise until you check with your health care provider. °· If you are overweight, you should lose weight. °· Try to maintain normal blood lipid levels. °· Keep your blood pressure under control as recommended by your health care provider. °· You should tell your health care provider right away about any increase in the severity or frequency of your chest discomfort or angina attacks. When you have angina, you should stop what you are doing and sit down. This may bring relief in 3 to 5 minutes. If your health care provider has prescribed nitro, take it as directed. °· If your health care provider has given you a follow-up appointment, it is very important to keep that appointment. Not keeping the appointment could result in a chronic or permanent injury, pain, and disability. If there is any problem keeping the appointment, you must call back to this facility for assistance. °SEEK IMMEDIATE MEDICAL CARE IF:  °· You develop   nausea, vomiting, or shortness of breath. °· You feel faint, lightheaded, or pass out. °· Your chest discomfort gets worse. °· You are sweating or experience sudden profound fatigue. °· You do not get relief of your chest pain after 3 doses of nitro. °· Your discomfort lasts longer than 15 minutes. °MAKE SURE YOU:  °· Understand these instructions. °· Will watch your condition. °· Will get  help right away if you are not doing well or get worse. °· Take all medicines as directed by your health care provider. °Document Released: 11/21/2005 Document Revised: 11/26/2013 Document Reviewed: 03/25/2014 °ExitCare® Patient Information ©2015 [employer]. This information is not intended to replace advice given to you by your health care provider. Make sure you discuss any questions you have with your health care provider. °Coronary Angiogram with Stent °Coronary angiography with stent placement is a procedure to widen or open a narrow blood vessel of the heart (coronary artery). When a coronary artery becomes partially blocked, it decreases blood flow to that area. This may lead to chest pain or a heart attack (myocardial infarction). Arteries may become blocked by cholesterol buildup (plaque) in the lining or wall.  °A stent is a small piece of metal that looks like a mesh or a spring. Stent placement may be done right after a coronary angiography in which a blocked artery is found or as a treatment for a heart attack.  °LET YOUR HEALTH CARE PROVIDER KNOW ABOUT: °· Any allergies you have.   °· All medicines you are taking, including vitamins, herbs, eye drops, creams, and over-the-counter medicines.   °· Previous problems you or members of your family have had with the use of anesthetics.   °· Any blood disorders you have.   °· Previous surgeries you have had.   °· Medical conditions you have. °RISKS AND COMPLICATIONS °Generally, coronary angiography with stent is a safe procedure. However, problems can occur and include: °· Damage to the heart or its blood vessels.   °· A return of blockage.   °· Bleeding, infection, or bruising at the insertion site.   °· A collection of blood under the skin (hematoma) at the insertion site. °· Blood clot in another part of the body.   °· Kidney injury.   °· Allergic reaction to the dye or contrast used.   °· Bleeding into the abdomen (retroperitoneal bleeding). °BEFORE  THE PROCEDURE °· Do not eat or drink anything after midnight on the night before the procedure or as directed by your health care provider.  °· Ask your health care provider about changing or stopping your regular medicines. This is especially important if you are taking diabetes medicines or blood thinners. °· Your health care provider will make sure you understand the procedure as well as the risks and potential problems associated with the procedure.   °PROCEDURE °· You may be given a medicine to help you relax before and during the procedure (sedative). This medicine will be given through an IV tube that is put into one of your veins.   °· The area where the catheter will be inserted will be shaved and cleaned. This is usually done in the groin but may be done in the fold of your arm (near your elbow) or in the wrist.    °· A medicine will be given to numb the area where the catheter will be inserted (local anesthetic).   °· The catheter will be inserted into an artery using a guide wire. A type of X-ray (fluoroscopy) will be used to help guide the catheter to the opening of the blocked   artery.   °· A dye will then be injected into the catheter, and X-rays will be taken. The dye will help to show where any narrowing or blockages are located in the heart arteries.   °· A tiny wire will be guided to the blocked spot, and a balloon will be inflated to make the artery wider. The stent will be expanded and will crush the plaque into the wall of the vessel. The stent will hold the area open like a scaffolding and improve the blood flow.   °· Sometimes the artery may be made wider using a laser or other tools to remove plaque.   °· When the blood flow is better, the catheter will be removed. The lining of the artery will grow over the stent, which stays where it was placed.   °AFTER THE PROCEDURE °· If the procedure is done through the leg, you will be kept in bed lying flat for about 6 hours. You will be instructed to  not bend or cross your legs.   °· The insertion site will be checked frequently.   °· The pulse in your feet or wrist will be checked frequently.   °· Additional blood tests, X-rays, and electrocardiography may be done. °Document Released: 05/28/2003 Document Revised: 04/07/2014 Document Reviewed: 05/30/2013 °ExitCare® Patient Information ©2015 [employer]. This information is not intended to replace advice given to you by your health care provider. Make sure you discuss any questions you have with your health care provider. ° °

## 2015-08-01 NOTE — Discharge Summary (Signed)
NAMEMarland Kitchen  CERINA, LEARY NO.:  [id number]  MEDICAL RECORD NO.:  [id number]  LOCATION:  3W21C                        FACILITY:  [facility]  PHYSICIAN:  Allegra Lai. Terrence Dupont, M.D. DATE OF BIRTH:  December 26, 1945  DATE OF ADMISSION:  07/29/2015 DATE OF DISCHARGE:  07/31/2015                              DISCHARGE SUMMARY   ADMITTING DIAGNOSES: 1. Acute inferoposterolateral wall myocardial infarction. 2. Hypertension. 3. Hyperlipidemia. 4. Morbid obesity. 5. Hypothyroidism. 6. Gastroesophageal reflux disease. 7. Elevated blood sugar, rule out diabetes.  FINAL DIAGNOSES: 1. Acute inferoposterior wall myocardial infarction status post     percutaneous coronary intervention to left circumflex. 2. Hypertension. 3. Hyperlipidemia. 4. Morbid obesity. 5. Hypothyroidism. 6. Gastroesophageal reflux disease.  DISCHARGE HOME MEDICATIONS: 1. Aspirin 81 mg 1 tablet daily. 2. Atorvastatin 40 mg daily. 3. Clopidogrel 75 mg daily. 4. Metoprolol succinate 25 mg daily. 5. Nitrostat 0.4 mg sublingual p.r.n. as directed. 6. Calcium carbonate with vitamin D 1 tablet daily as before. 7. Levothyroxine 75 mcg daily. 8. Omeprazole 20 mg daily. 9. Patient has been advised to stop amlodipine and lovastatin.  DIET:  Low-salt, low-cholesterol 1800 calories, ADA diet.  Post cardiac cath/PTCA stent instructions have been given.  FOLLOWUP:  With me in 1 week.  CONDITION AT DISCHARGE:  Stable.  The patient will be scheduled for phase 2 cardiac rehab as outpatient condition on discharge is stable.  BRIEF HISTORY AND HOSPITAL COURSE:  Ms. Cordoba is 69 year old female with past medical history significant for hypertension, hyperlipidemia, hypothyroidism, morbid obesity, GERD, was transferred from [facility] as code STEMI was called.  Patient complained of retrosternal chest pain associated with burning in the throat, took Tums without relief, so decided to go to the ED.  EKG done  in the ED showed sinus bradycardia with ST elevation in inferolateral leads and ST depression in leads V1 to V3 with minimal reciprocal changes in lead 1 and aVL suggestive of acute inferoposterolateral wall injury.  Patient received aspirin, IV heparin, 600 mg of Plavix, and was transferred emergently to [facility] for emergency PCI.  The patient denies such episodes of chest pain in the past.  Denies history of exertional chest pain.  Denies shortness of breath.  Denies PND, orthopnea, or leg swelling.  PHYSICAL EXAMINATION:  GENERAL:  She was alert, awake, oriented x3, hemodynamically stable. HEENT:  Conjunctivae were pink. NECK:  Supple.  No JVD.  No bruit. LUNGS:  Clear to auscultation without rhonchi or rales. CARDIOVASCULAR:  S1, S2 was normal.  There was soft systolic murmur and S4 gallop. ABDOMEN:  Soft.  Bowel sounds were present.  Obese, nontender. EXTREMITIES:  There is no clubbing, cyanosis, or edema.  LABORATORY DATA:  Her labs, sodium was 139, potassium 3.3, BUN 26, creatinine 1.12.  Hemoglobin was 12.1, hematocrit 37.4, white count of 7.4.  Troponin I was 1.28, 1.82, 1.63, 3.19.  Post PCI today is trending down to 2.08.  Her cholesterol was 154, triglycerides 139, HDL 45, LDL 81.  EKG done yesterday showed sinus bradycardia with normalization of ST-T wave changes.  BRIEF HOSPITAL COURSE:  The patient was emergently taken to the cath lab and underwent left cardiac cath and PTCA  stenting to left circumflex with excellent angiographic results as per procedure report.  The patient did not have any further episodes of chest pain during the hospital stay.  Her groin is stable with no evidence of hematoma or bruit.  Phase 1 cardiac rehab was called.  The patient has been ambulating in hallway and room without any problems.  The patient will be discharged home on above medications.  Discussed with patient regarding lifestyle changes, diet, compliance with  medication, and cardiac phase 2 rehab to which she agrees.  The patient will be followed up in my office in 1 week.     Allegra Lai. Terrence Dupont, M.D.     MNH/MEDQ  D:  07/31/2015  T:  08/01/2015  Job:  [id number]

## 2015-09-29 ENCOUNTER — Other Ambulatory Visit: Payer: Self-pay | Admitting: Family Medicine

## 2015-09-29 DIAGNOSIS — Z1231 Encounter for screening mammogram for malignant neoplasm of breast: Secondary | ICD-10-CM

## 2015-10-22 ENCOUNTER — Ambulatory Visit
Admission: RE | Admit: 2015-10-22 | Discharge: 2015-10-22 | Disposition: A | Discharge status: Home / Self Care | Payer: Medicare Other | Source: Ambulatory Visit | Attending: Family Medicine | Admitting: Family Medicine

## 2015-10-22 ENCOUNTER — Other Ambulatory Visit: Payer: Self-pay | Admitting: Family Medicine

## 2015-10-22 DIAGNOSIS — Z1231 Encounter for screening mammogram for malignant neoplasm of breast: Secondary | ICD-10-CM

## 2016-07-03 ENCOUNTER — Encounter: Payer: Self-pay | Admitting: Urgent Care

## 2016-07-03 ENCOUNTER — Emergency Department
Admission: EM | Admit: 2016-07-03 | Discharge: 2016-07-03 | Disposition: A | Discharge status: Home / Self Care | Payer: Medicare Other | Attending: Emergency Medicine | Admitting: Emergency Medicine

## 2016-07-03 ENCOUNTER — Emergency Department: Payer: Medicare Other

## 2016-07-03 DIAGNOSIS — R109 Unspecified abdominal pain: Secondary | ICD-10-CM | POA: Diagnosis not present

## 2016-07-03 DIAGNOSIS — M7918 Myalgia, other site: Secondary | ICD-10-CM

## 2016-07-03 DIAGNOSIS — M791 Myalgia: Secondary | ICD-10-CM | POA: Diagnosis not present

## 2016-07-03 DIAGNOSIS — I252 Old myocardial infarction: Secondary | ICD-10-CM | POA: Insufficient documentation

## 2016-07-03 DIAGNOSIS — Z87891 Personal history of nicotine dependence: Secondary | ICD-10-CM | POA: Diagnosis not present

## 2016-07-03 DIAGNOSIS — I10 Essential (primary) hypertension: Secondary | ICD-10-CM | POA: Diagnosis not present

## 2016-07-03 DIAGNOSIS — E039 Hypothyroidism, unspecified: Secondary | ICD-10-CM | POA: Insufficient documentation

## 2016-07-03 DIAGNOSIS — Z7982 Long term (current) use of aspirin: Secondary | ICD-10-CM | POA: Insufficient documentation

## 2016-07-03 DIAGNOSIS — M549 Dorsalgia, unspecified: Secondary | ICD-10-CM | POA: Diagnosis present

## 2016-07-03 HISTORY — DX: Acute myocardial infarction, unspecified: I21.9

## 2016-07-03 LAB — URINALYSIS COMPLETE WITH MICROSCOPIC (ARMC ONLY)
Bilirubin Urine: NEGATIVE
Glucose, UA: NEGATIVE mg/dL
Hgb urine dipstick: NEGATIVE
Ketones, ur: NEGATIVE mg/dL
LEUKOCYTES UA: NEGATIVE
Nitrite: NEGATIVE
PH: 6 (ref 5.0–8.0)
PROTEIN: NEGATIVE mg/dL
Specific Gravity, Urine: 1.002 — ABNORMAL LOW (ref 1.005–1.030)

## 2016-07-03 LAB — CBC
HEMATOCRIT: 40.4 % (ref 35.0–47.0)
Hemoglobin: 13.9 g/dL (ref 12.0–16.0)
MCH: 31 pg (ref 26.0–34.0)
MCHC: 34.5 g/dL (ref 32.0–36.0)
MCV: 89.9 fL (ref 80.0–100.0)
Platelets: 217 10*3/uL (ref 150–440)
RBC: 4.49 MIL/uL (ref 3.80–5.20)
RDW: 13 % (ref 11.5–14.5)
WBC: 4.5 10*3/uL (ref 3.6–11.0)

## 2016-07-03 LAB — COMPREHENSIVE METABOLIC PANEL
ALT: 17 U/L (ref 14–54)
AST: 22 U/L (ref 15–41)
Albumin: 4.2 g/dL (ref 3.5–5.0)
Alkaline Phosphatase: 73 U/L (ref 38–126)
Anion gap: 9 (ref 5–15)
BILIRUBIN TOTAL: 0.9 mg/dL (ref 0.3–1.2)
BUN: 12 mg/dL (ref 6–20)
CO2: 24 mmol/L (ref 22–32)
Calcium: 9.5 mg/dL (ref 8.9–10.3)
Chloride: 110 mmol/L (ref 101–111)
Creatinine, Ser: 0.93 mg/dL (ref 0.44–1.00)
GFR calc Af Amer: >60 mL/min (ref >60)
Glucose, Bld: 89 mg/dL (ref 65–99)
POTASSIUM: 3.7 mmol/L (ref 3.5–5.1)
Sodium: 143 mmol/L (ref 135–145)
TOTAL PROTEIN: 7 g/dL (ref 6.5–8.1)

## 2016-07-03 LAB — LIPASE, BLOOD: LIPASE: 25 U/L (ref 11–51)

## 2016-07-03 LAB — TROPONIN I: Troponin I: <0.03 ng/mL (ref <0.03)

## 2016-07-03 MED ORDER — TRAMADOL HCL 50 MG PO TABS: 50.0000 mg | ORAL_TABLET | Freq: Four times a day (QID) | ORAL | 0 refills | Status: AC | PRN

## 2016-07-03 NOTE — ED Provider Notes (Signed)
[facility] Emergency Department Provider Note  Time seen: 7:54 AM  I have reviewed the triage vital signs and the nursing notes.   HISTORY  Chief Complaint Back Pain    HPI Alison Lyons is a 69 y.o. female with a past medical history of gastric reflux, hypertension, hyperlipidemia, hypothyroidism, MI, who presents the emergency department with left flank pain. According to the patient since yesterday she has been experiencing pain in her left back which radiates to her left mid abdomen. States the pain is worse with movement. Denies dysuria or hematuria. Denies history of kidney stones. Denies nausea, vomiting, diarrhea. Denies fever. Describes the pain is mild dull pain currently located mostly in the left mid back.  Past Medical History:  Diagnosis Date  . GERD (gastroesophageal reflux disease)   . Hyperlipidemia   . Hypertension   . Hypothyroidism   . Myocardial infarction [facility])     Patient Active Problem List   Diagnosis Date Noted  . Acute MI, inferoposterior wall ([place]) 07/29/2015    Past Surgical History:  Procedure Laterality Date  . ABDOMINAL HYSTERECTOMY     Partial  . CARDIAC CATHETERIZATION N/A 07/29/2015   Procedure: Left Heart Cath and Coronary Angiography;  Surgeon: Charolette Forward, MD;  Location: [facility];  Service: Cardiovascular;  Laterality: N/A;  . CARDIAC CATHETERIZATION N/A 07/29/2015   Procedure: Coronary Stent Intervention;  Surgeon: Charolette Forward, MD;  Location: [facility];  Service: Cardiovascular;  Laterality: N/A;  . CORONARY ANGIOPLASTY WITH STENT PLACEMENT      Prior to Admission medications   Medication Sig Start Date End Date Taking? Authorizing Provider  aspirin EC 81 MG EC tablet Take 1 tablet (81 mg total) by mouth daily. 07/31/15   Charolette Forward, MD  atorvastatin (LIPITOR) 40 MG tablet Take 1 tablet (40 mg total) by mouth daily at 6 PM. 07/31/15   Charolette Forward, MD  Calcium Carbonate-Vitamin D  600-400 MG-UNIT per tablet Take 1 tablet by mouth daily.    Historical Provider, MD  clopidogrel (PLAVIX) 75 MG tablet Take 1 tablet (75 mg total) by mouth daily with breakfast. 07/31/15   Charolette Forward, MD  levothyroxine (SYNTHROID, LEVOTHROID) 75 MCG tablet Take 1 tablet by mouth every morning. Take 1 tablet every morning on an empty stomach with water 30-60 minutes before breakfast. 11/03/14   Historical Provider, MD  metoprolol succinate (TOPROL XL) 25 MG 24 hr tablet Take 1 tablet (25 mg total) by mouth daily. 07/31/15   Charolette Forward, MD  nitroGLYCERIN (NITROSTAT) 0.4 MG SL tablet Place 1 tablet (0.4 mg total) under the tongue every 5 (five) minutes x 3 doses as needed for chest pain. 07/31/15   Charolette Forward, MD  omeprazole (PRILOSEC) 20 MG capsule Take 1 capsule by mouth daily. 03/02/15   Historical Provider, MD    Allergies  Allergen Reactions  . Lisinopril Other (See Comments)    No family history on file.  Social History Social History  Substance Use Topics  . Smoking status: Former Research scientist (life sciences)  . Smokeless tobacco: Never Used  . Alcohol use No    Review of Systems Constitutional: Negative for fever. Eyes: Negative for visual changes. ENT: Negative for congestion Cardiovascular: Negative for chest pain. Respiratory: Negative for shortness of breath. Gastrointestinal: Negative for abdominal pain, vomiting and diarrhea. Genitourinary: Negative for dysuria. Musculoskeletal: Negative for back pain. Skin: Negative for rash. Neurological: Negative for headaches, focal weakness or numbness. 10-point ROS otherwise negative.  ____________________________________________  PHYSICAL EXAM:  VITAL SIGNS: ED Triage Vitals  Enc Vitals Group     BP 07/03/16 0639 (!) 167/94     Pulse Rate 07/03/16 0639 64     Resp 07/03/16 0639 16     Temp 07/03/16 0639 98.6 F (37 C)     Temp Source 07/03/16 0639 Tympanic     SpO2 07/03/16 0639 99 %     Weight 07/03/16 0639 143 lb (64.9 kg)      Height 07/03/16 0639 5' (1.524 m)     Head Circumference --      Peak Flow --      Pain Score 07/03/16 0640 5     Pain Loc --      Pain Edu? --      Excl. in [place]? --     Constitutional: Alert and oriented. Well appearing and in no distress. Eyes: Normal exam ENT   Head: Normocephalic and atraumatic.   Mouth/Throat: Mucous membranes are moist. Cardiovascular: Normal rate, regular rhythm. No murmur Respiratory: Normal respiratory effort without tachypnea nor retractions. Breath sounds are clear and equal bilaterally.  Gastrointestinal: Soft and nontender. No distention. No CVA tenderness palpation. Musculoskeletal: Nontender with normal range of motion in all extremities.  Neurologic:  Normal speech and language. No gross focal neurologic deficits are appreciated. Skin:  Skin is warm, dry and intact.  Psychiatric: Mood and affect are normal. Speech and behavior are normal.   ____________________________________________     RADIOLOGY  CT scan shows no acute abnormality.  ____________________________________________   INITIAL IMPRESSION / ASSESSMENT AND PLAN / ED COURSE  Pertinent labs & imaging results that were available during my care of the patient were reviewed by me and considered in my medical decision making (see chart for details).  Patient presents the emergency department 2 days of left back pain/left flank pain. Patient's urinalysis is normal. Patient does state the pain tends to occur mostly with movement especially twisting movement such as rolling over in bed. Patient has a nontender exam. No back tenderness to palpation, no CVA tenderness, no abdominal tenderness. We will check basic labs, and proceed with a CT renal scan to rule out ureterolithiasis. If CT is negative we will discharge with a short course of pain medication to help with the patient's likely muscular skeletal discomfort.  CT shows no acute abnormality. Labs are largely within normal limits. We  will discharge the patient home with PCP follow-up. We'll discharge a short course of Ultram as needed for discomfort. Highly suspect musculoskeletal discomfort.  ____________________________________________   FINAL CLINICAL IMPRESSION(S) / ED DIAGNOSES  Left flank pain Musculoskeletal pain.   Harvest Dark, MD 07/03/16 [phone]

## 2016-07-03 NOTE — ED Notes (Signed)
Pt verbalized understanding of discharge instructions. NAD at this time. 

## 2016-07-03 NOTE — ED Triage Notes (Signed)
Patient presents with c/o LEFT lower back pain with radiation into flank. Patient denies urinary symptoms. Denies injury.

## 2016-07-03 NOTE — ED Notes (Signed)
Patient transported to CT 

## 2016-09-16 ENCOUNTER — Other Ambulatory Visit: Payer: Self-pay | Admitting: Family Medicine

## 2016-09-30 ENCOUNTER — Other Ambulatory Visit: Payer: Self-pay | Admitting: Family Medicine

## 2016-09-30 DIAGNOSIS — Z1231 Encounter for screening mammogram for malignant neoplasm of breast: Secondary | ICD-10-CM

## 2016-11-15 ENCOUNTER — Ambulatory Visit
Admission: RE | Admit: 2016-11-15 | Discharge: 2016-11-15 | Disposition: A | Discharge status: Home / Self Care | Payer: Medicare Other | Source: Ambulatory Visit | Attending: Family Medicine | Admitting: Family Medicine

## 2016-11-15 ENCOUNTER — Encounter: Payer: Self-pay | Admitting: Radiology

## 2016-11-15 DIAGNOSIS — Z1231 Encounter for screening mammogram for malignant neoplasm of breast: Secondary | ICD-10-CM | POA: Insufficient documentation

## 2017-08-31 ENCOUNTER — Other Ambulatory Visit: Payer: Self-pay | Admitting: Cardiology

## 2017-08-31 DIAGNOSIS — R079 Chest pain, unspecified: Secondary | ICD-10-CM

## 2017-09-08 ENCOUNTER — Ambulatory Visit (HOSPITAL_COMMUNITY)
Admission: RE | Admit: 2017-09-08 | Discharge: 2017-09-08 | Disposition: A | Discharge status: Home / Self Care | Payer: Medicare Other | Source: Ambulatory Visit | Attending: Cardiology | Admitting: Cardiology

## 2017-09-08 DIAGNOSIS — R079 Chest pain, unspecified: Secondary | ICD-10-CM | POA: Insufficient documentation

## 2017-09-08 MED ORDER — TECHNETIUM TC 99M TETROFOSMIN IV KIT
30.0000 | PACK | Freq: Once | INTRAVENOUS | Status: AC | PRN
  Administered 2017-09-08: 30 via INTRAVENOUS

## 2017-09-08 MED ORDER — REGADENOSON 0.4 MG/5ML IV SOLN
0.4000 mg | Freq: Once | INTRAVENOUS | Status: AC
  Administered 2017-09-08: 0.4 mg via INTRAVENOUS

## 2017-09-08 MED ORDER — REGADENOSON 0.4 MG/5ML IV SOLN
INTRAVENOUS | Status: AC
  Administered 2017-09-08: 0.4 mg via INTRAVENOUS
  Filled 2017-09-08: qty 5

## 2017-10-09 ENCOUNTER — Other Ambulatory Visit: Payer: Self-pay | Admitting: Family Medicine

## 2017-10-09 DIAGNOSIS — Z1231 Encounter for screening mammogram for malignant neoplasm of breast: Secondary | ICD-10-CM

## 2017-11-16 ENCOUNTER — Ambulatory Visit
Admission: RE | Admit: 2017-11-16 | Discharge: 2017-11-16 | Disposition: A | Discharge status: Home / Self Care | Payer: Medicare Other | Source: Ambulatory Visit | Attending: Family Medicine | Admitting: Family Medicine

## 2017-11-16 DIAGNOSIS — Z1231 Encounter for screening mammogram for malignant neoplasm of breast: Secondary | ICD-10-CM | POA: Insufficient documentation

## 2018-10-11 ENCOUNTER — Other Ambulatory Visit: Payer: Self-pay | Admitting: Family Medicine

## 2018-10-11 DIAGNOSIS — Z1231 Encounter for screening mammogram for malignant neoplasm of breast: Secondary | ICD-10-CM

## 2018-11-19 ENCOUNTER — Ambulatory Visit
Admission: RE | Admit: 2018-11-19 | Discharge: 2018-11-19 | Disposition: A | Discharge status: Home / Self Care | Payer: Medicare Other | Source: Ambulatory Visit | Attending: Family Medicine | Admitting: Family Medicine

## 2018-11-19 DIAGNOSIS — Z1231 Encounter for screening mammogram for malignant neoplasm of breast: Secondary | ICD-10-CM | POA: Diagnosis present

## 2018-11-19 HISTORY — DX: Malignant (primary) neoplasm, unspecified: C80.1

## 2019-10-14 ENCOUNTER — Other Ambulatory Visit: Payer: Self-pay | Admitting: Family Medicine

## 2019-10-14 DIAGNOSIS — Z1231 Encounter for screening mammogram for malignant neoplasm of breast: Secondary | ICD-10-CM

## 2019-11-08 ENCOUNTER — Emergency Department: Payer: Medicare Other

## 2019-11-08 ENCOUNTER — Other Ambulatory Visit: Payer: Self-pay

## 2019-11-08 ENCOUNTER — Emergency Department
Admission: EM | Admit: 2019-11-08 | Discharge: 2019-11-08 | Disposition: A | Discharge status: Other Acute Inpatient Hospital | Payer: Medicare Other | Attending: Emergency Medicine | Admitting: Emergency Medicine

## 2019-11-08 DIAGNOSIS — R4182 Altered mental status, unspecified: Secondary | ICD-10-CM | POA: Insufficient documentation

## 2019-11-08 DIAGNOSIS — I610 Nontraumatic intracerebral hemorrhage in hemisphere, subcortical: Secondary | ICD-10-CM | POA: Diagnosis not present

## 2019-11-08 DIAGNOSIS — R0603 Acute respiratory distress: Secondary | ICD-10-CM | POA: Insufficient documentation

## 2019-11-08 DIAGNOSIS — R404 Transient alteration of awareness: Secondary | ICD-10-CM | POA: Diagnosis present

## 2019-11-08 DIAGNOSIS — Z20828 Contact with and (suspected) exposure to other viral communicable diseases: Secondary | ICD-10-CM | POA: Insufficient documentation

## 2019-11-08 DIAGNOSIS — I629 Nontraumatic intracranial hemorrhage, unspecified: Secondary | ICD-10-CM | POA: Diagnosis not present

## 2019-11-08 DIAGNOSIS — T1490XA Injury, unspecified, initial encounter: Secondary | ICD-10-CM

## 2019-11-08 LAB — COMPREHENSIVE METABOLIC PANEL
ALT: 26 U/L (ref 0–44)
AST: 34 U/L (ref 15–41)
Albumin: 3.8 g/dL (ref 3.5–5.0)
Alkaline Phosphatase: 104 U/L (ref 38–126)
Anion gap: 12 (ref 5–15)
BUN: 13 mg/dL (ref 8–23)
CO2: 19 mmol/L — ABNORMAL LOW (ref 22–32)
Calcium: 8.3 mg/dL — ABNORMAL LOW (ref 8.9–10.3)
Chloride: 111 mmol/L (ref 98–111)
Creatinine, Ser: 0.7 mg/dL (ref 0.44–1.00)
GFR calc Af Amer: >60 mL/min (ref >60)
GFR calc non Af Amer: >60 mL/min (ref >60)
Glucose, Bld: 166 mg/dL — ABNORMAL HIGH (ref 70–99)
Potassium: 3.1 mmol/L — ABNORMAL LOW (ref 3.5–5.1)
Sodium: 142 mmol/L (ref 135–145)
Total Bilirubin: 0.6 mg/dL (ref 0.3–1.2)
Total Protein: 6.8 g/dL (ref 6.5–8.1)

## 2019-11-08 LAB — URINE DRUG SCREEN, QUALITATIVE (ARMC ONLY)
Amphetamines, Ur Screen: NOT DETECTED
Barbiturates, Ur Screen: NOT DETECTED
Benzodiazepine, Ur Scrn: NOT DETECTED
Cannabinoid 50 Ng, Ur ~~LOC~~: NOT DETECTED
Cocaine Metabolite,Ur ~~LOC~~: NOT DETECTED
MDMA (Ecstasy)Ur Screen: NOT DETECTED
Methadone Scn, Ur: NOT DETECTED
Opiate, Ur Screen: NOT DETECTED
Phencyclidine (PCP) Ur S: NOT DETECTED
Tricyclic, Ur Screen: NOT DETECTED

## 2019-11-08 LAB — URINALYSIS, ROUTINE W REFLEX MICROSCOPIC
Bilirubin Urine: NEGATIVE
Glucose, UA: 50 mg/dL — AB
Hgb urine dipstick: NEGATIVE
Ketones, ur: NEGATIVE mg/dL
Leukocytes,Ua: NEGATIVE
Nitrite: NEGATIVE
Protein, ur: NEGATIVE mg/dL
Specific Gravity, Urine: 1.012 (ref 1.005–1.030)
pH: 6 (ref 5.0–8.0)

## 2019-11-08 LAB — CBC WITH DIFFERENTIAL/PLATELET
Abs Immature Granulocytes: 0.07 10*3/uL (ref 0.00–0.07)
Basophils Absolute: 0 10*3/uL (ref 0.0–0.1)
Basophils Relative: 0 %
Eosinophils Absolute: 0 10*3/uL (ref 0.0–0.5)
Eosinophils Relative: 0 %
HCT: 41.4 % (ref 36.0–46.0)
Hemoglobin: 13.9 g/dL (ref 12.0–15.0)
Immature Granulocytes: 1 %
Lymphocytes Relative: 2 %
Lymphs Abs: 0.3 10*3/uL — ABNORMAL LOW (ref 0.7–4.0)
MCH: 30.3 pg (ref 26.0–34.0)
MCHC: 33.6 g/dL (ref 30.0–36.0)
MCV: 90.2 fL (ref 80.0–100.0)
Monocytes Absolute: 0.7 10*3/uL (ref 0.1–1.0)
Monocytes Relative: 5 %
Neutro Abs: 12.9 10*3/uL — ABNORMAL HIGH (ref 1.7–7.7)
Neutrophils Relative %: 92 %
Platelets: 212 10*3/uL (ref 150–400)
RBC: 4.59 MIL/uL (ref 3.87–5.11)
RDW: 12.4 % (ref 11.5–15.5)
WBC: 14 10*3/uL — ABNORMAL HIGH (ref 4.0–10.5)
nRBC: 0 % (ref 0.0–0.2)

## 2019-11-08 LAB — PROTIME-INR
INR: 1.2 (ref 0.8–1.2)
Prothrombin Time: 14.8 seconds (ref 11.4–15.2)

## 2019-11-08 LAB — BLOOD GAS, ARTERIAL
Bicarbonate: 19.7 mmol/L — ABNORMAL LOW (ref 20.0–28.0)
FIO2: 0.4
MECHVT: 450 mL
O2 Saturation: 99.9 %
PEEP: 5 cmH2O
RATE: 16 resp/min
pCO2 arterial: 42 mmHg (ref 32.0–48.0)
pH, Arterial: 7.28 — ABNORMAL LOW (ref 7.350–7.450)
pO2, Arterial: 174 mmHg — ABNORMAL HIGH (ref 83.0–108.0)

## 2019-11-08 LAB — TYPE AND SCREEN
ABO/RH(D): A POS
Antibody Screen: NEGATIVE

## 2019-11-08 LAB — T4, FREE: Free T4: 0.96 ng/dL (ref 0.61–1.12)

## 2019-11-08 LAB — CK: Total CK: 107 U/L (ref 38–234)

## 2019-11-08 LAB — MAGNESIUM: Magnesium: 1.9 mg/dL (ref 1.7–2.4)

## 2019-11-08 LAB — TSH: TSH: 0.688 u[IU]/mL (ref 0.350–4.500)

## 2019-11-08 LAB — TROPONIN I (HIGH SENSITIVITY): Troponin I (High Sensitivity): 9 ng/L (ref <18)

## 2019-11-08 LAB — SARS CORONAVIRUS 2 BY RT PCR (HOSPITAL ORDER, PERFORMED IN ~~LOC~~ HOSPITAL LAB): SARS Coronavirus 2: NEGATIVE

## 2019-11-08 LAB — LIPASE, BLOOD: Lipase: 23 U/L (ref 11–51)

## 2019-11-08 LAB — ETHANOL: Alcohol, Ethyl (B): <10 mg/dL (ref <10)

## 2019-11-08 LAB — APTT: aPTT: 28 seconds (ref 24–36)

## 2019-11-08 MED ORDER — MANNITOL 25 % IV SOLN: 75.0000 g | Freq: Once | INTRAVENOUS | Status: DC

## 2019-11-08 MED ORDER — MANNITOL 25 % IV SOLN
25.0000 g | Freq: Once | INTRAVENOUS | Status: DC
  Filled 2019-11-08: qty 100

## 2019-11-08 MED ORDER — FENTANYL 2500MCG IN NS 250ML (10MCG/ML) PREMIX INFUSION
0.0000 ug/h | INTRAVENOUS | Status: DC
  Administered 2019-11-08: 25 ug/h via INTRAVENOUS
  Filled 2019-11-08: qty 250

## 2019-11-08 MED ORDER — MANNITOL 25 % IV SOLN
75.0000 g | Freq: Once | Status: AC
  Administered 2019-11-08: 75 g via INTRAVENOUS
  Filled 2019-11-08: qty 300

## 2019-11-08 MED ORDER — NICARDIPINE HCL IN NACL 20-0.86 MG/200ML-% IV SOLN
3.0000 mg/h | INTRAVENOUS | Status: DC
  Administered 2019-11-08: 3 mg/h via INTRAVENOUS
  Filled 2019-11-08: qty 200

## 2019-11-08 MED ORDER — PROPOFOL 1000 MG/100ML IV EMUL
5.0000 ug/kg/min | INTRAVENOUS | Status: DC
  Administered 2019-11-08: 5 ug/kg/min via INTRAVENOUS

## 2019-11-08 MED ORDER — DEXAMETHASONE SODIUM PHOSPHATE 10 MG/ML IJ SOLN
10.0000 mg | Freq: Once | INTRAMUSCULAR | Status: DC
  Filled 2019-11-08: qty 1

## 2019-11-08 MED ORDER — ETOMIDATE 2 MG/ML IV SOLN
INTRAVENOUS | Status: AC | PRN
  Administered 2019-11-08: 20 mg via INTRAVENOUS

## 2019-11-08 MED ORDER — MANNITOL 20 % IV SOLN: 75.0000 g | Freq: Once | Status: DC

## 2019-11-08 MED ORDER — IOHEXOL 350 MG/ML SOLN
75.0000 mL | Freq: Once | INTRAVENOUS | Status: AC | PRN
  Administered 2019-11-08: 75 mL via INTRAVENOUS

## 2019-11-08 MED ORDER — ROCURONIUM BROMIDE 50 MG/5ML IV SOLN
INTRAVENOUS | Status: AC | PRN
  Administered 2019-11-08: 100 mg via INTRAVENOUS

## 2019-11-08 NOTE — Consult Note (Signed)
Reason for Consult:ICH Referring Physician: Jari Pigg  CC: AMS  HPI: Alison Lyons is an 73 y.o. female who is intubated and sedated and therefore unable to provide any history.  All history obtained from the chart.  Patient family had not heard from her since yesterday so they had a wellness check on her.  Patient was found down laying on the ground face down, with agonal breathing.  Blood sugar was normal.  Patient was given Narcan with minimal to no response.  Patient was being bagged due to shallow respirations with poor end-tidal. home. Per family LKW was 2100 last night. GCS of 3 on presentation.    LKW 2100 11/07/2019 No tPA due to hemorrhage on imaging  ICH Score: 4  No past medical history on file.   Family history: Unable to provide due to inutbation  Social History:  has no history on file for tobacco, alcohol, and drug.  Not on File  Medications: I have reviewed the patient's current medications. Prior to Admission medications   Not on File     ROS: Unable to obtain due to intubation  Physical Examination: Blood pressure (!) 165/79, pulse (!) 125, temperature (!) 95.4 F (35.2 C), resp. rate (!) 31, height 5\' 6"  (1.676 m), weight 68 kg, SpO2 100 %.  HEENT-  Normocephalic, no lesions, without obvious abnormality.  Normal external eye and conjunctiva.  Normal TM's bilaterally.  Normal auditory canals and external ears. Normal external nose, mucus membranes and septum.  Normal pharynx. Cardiovascular- S1, S2 normal, pulses palpable throughout   Lungs- chest clear, no wheezing, rales, normal symmetric air entry Abdomen- soft, non-tender; bowel sounds normal; no masses,  no organomegaly Extremities- no edema Lymph-no adenopathy palpable Musculoskeletal-no joint tenderness, deformity or swelling Skin-erythematous rash on skin  Neurological Examination   Mental Status: Patient does not respond to verbal stimuli.  Does not respond to deep sternal rub.  Does not follow  commands.  No verbalizations noted.  Cranial Nerves: II: patient does not respond confrontation bilaterally, pupils right 3 mm, left 3 mm,and minimally reactive bilaterally III,IV,VI: Oculocephalic response absent bilaterally.  V,VII: corneal reflex absent bilaterally  VIII: patient does not respond to verbal stimuli IX,X: gag reflex reduced, XI: trapezius strength unable to test bilaterally XII: tongue strength unable to test Motor: Extremities flaccid throughout.  No spontaneous movement noted.  No purposeful movements noted. Sensory: Some movement to noxious stimuli in the lower extemities Deep Tendon Reflexes:  Symmetric throughout. Plantars: upgoing bilaterally Cerebellar: Unable to perform   Laboratory Studies:   Basic Metabolic Panel: No results for input(s): NA, K, CL, CO2, GLUCOSE, BUN, CREATININE, CALCIUM, MG, PHOS in the last 168 hours.  Liver Function Tests: No results for input(s): AST, ALT, ALKPHOS, BILITOT, PROT, ALBUMIN in the last 168 hours. No results for input(s): LIPASE, AMYLASE in the last 168 hours. No results for input(s): AMMONIA in the last 168 hours.  CBC: Recent Labs  Lab 11/08/19 1146  WBC 14.0*  NEUTROABS 12.9*  HGB 13.9  HCT 41.4  MCV 90.2  PLT 212    Cardiac Enzymes: No results for input(s): CKTOTAL, CKMB, CKMBINDEX, TROPONINI in the last 168 hours.  BNP: Invalid input(s): POCBNP  CBG: No results for input(s): GLUCAP in the last 168 hours.  Microbiology: No results found for this or any previous visit.  Coagulation Studies: No results for input(s): LABPROT, INR in the last 72 hours.  Urinalysis:  Recent Labs  Lab 11/08/19 [place] 1.012  PHURINE  6.0  GLUCOSEU 50*  HGBUR NEGATIVE  BILIRUBINUR NEGATIVE  KETONESUR NEGATIVE  PROTEINUR NEGATIVE  NITRITE NEGATIVE  LEUKOCYTESUR NEGATIVE    Lipid Panel:  No results found for: CHOL, TRIG, HDL, CHOLHDL, VLDL, LDLCALC  HgbA1C: No results found for:  HGBA1C  Urine Drug Screen:  No results found for: LABOPIA, COCAINSCRNUR, LABBENZ, AMPHETMU, THCU, LABBARB  Alcohol Level:  Recent Labs  Lab 11/08/19 [employer] <10    Other results: EKG: sinus rhythm at 65 bpm.  Imaging: Ct Abdomen Pelvis Wo Contrast  Result Date: 11/08/2019 CLINICAL DATA:  Found face down at home. EXAM: CT CHEST, ABDOMEN AND PELVIS WITHOUT CONTRAST TECHNIQUE: Multidetector CT imaging of the chest, abdomen and pelvis was performed following the standard protocol without IV contrast. COMPARISON:  None. FINDINGS: CT CHEST FINDINGS Cardiovascular: The heart is normal in size. No pericardial effusion. There is mild tortuosity and calcification of the thoracic aorta. Moderate to advanced three-vessel coronary artery calcifications. Mediastinum/Nodes: No mediastinal or hilar mass or adenopathy. The endotracheal tube is in good position at the mid tracheal level. There is a moderate to large hiatal hernia noted. Lungs/Pleura: No acute pulmonary findings. Minimal dependent atelectasis. No infiltrates or effusions. No pneumothorax. No pulmonary contusion. Musculoskeletal: The bony thorax is intact. No sternal or rib fractures are identified. Remote healed left-sided rib fractures are noted. The vertebral bodies are normally aligned. No definite acute thoracic spine fractures. CT ABDOMEN PELVIS FINDINGS Hepatobiliary: No focal hepatic lesions or acute hepatic injury. The gallbladder appears normal. No common bile duct dilatation. Pancreas: No mass, inflammation or ductal dilatation. Spleen: Normal size.  No focal lesions. Adrenals/Urinary Tract: Adrenal glands and kidneys are grossly normal without contrast. No worrisome renal lesions. No renal or obstructing ureteral calculi. No bladder calculi. There is a Foley catheter in the bladder. Stomach/Bowel: The stomach, duodenum, small bowel and colon are grossly normal without oral contrast. No inflammatory changes, mass lesions or obstructive  findings. The terminal ileum and appendix are normal. Vascular/Lymphatic: Moderate to advanced atherosclerotic calcifications involving the distal aorta and iliac arteries but no aneurysm. No mesenteric or retroperitoneal mass, adenopathy or hematoma. Reproductive: Surgically absent. Other: No pelvic mass or adenopathy. No free pelvic fluid collections. No inguinal mass or adenopathy. No abdominal wall hernia or subcutaneous lesions. Musculoskeletal: The lumbar vertebral bodies are normally aligned. No acute fracture. The bony pelvis is intact. The pubic symphysis and SI joints are intact. Both hips are normally located. IMPRESSION: 1. No acute cardiopulmonary findings are identified without contrast and the bony thorax is intact. 2. Moderate-sized hiatal hernia. 3. Three-vessel coronary artery calcifications. 4. No acute abdominal/pelvic findings. The solid abdominal organs are intact and no secondary signs for a bowel injury. 5. Intact bony pelvis. Electronically Signed   By: Marijo Sanes M.D.   On: 11/08/2019 12:21   Dg Chest 1 View  Result Date: 11/08/2019 CLINICAL DATA:  Patient found down and unresponsive today. EXAM: CHEST  1 VIEW COMPARISON:  None. FINDINGS: Endotracheal tube is in place with the tip in good position 3.6 cm above the carina. Defibrillator pads noted. Lungs are clear. Heart size is normal. There is aortic atherosclerosis. No pneumothorax or pleural fluid. No acute or focal bony abnormality. IMPRESSION: ETT in good position. Lungs clear. Atherosclerosis. Electronically Signed   By: Inge Rise M.D.   On: 11/08/2019 11:18   Ct Head Wo Contrast  Result Date: 11/08/2019 CLINICAL DATA:  Altered consciousness. EXAM: CT HEAD WITHOUT CONTRAST CT CERVICAL SPINE WITHOUT CONTRAST TECHNIQUE: Multidetector  CT imaging of the head and cervical spine was performed following the standard protocol without intravenous contrast. Multiplanar CT image reconstructions of the cervical spine were also  generated. COMPARISON:  None. FINDINGS: CT HEAD FINDINGS Brain: Large area of acute hemorrhage in the left parietal lobe. Hyperdense blood is present throughout most of the hematoma. Inferiorly there is an area of slightly decreased density of acute or subacute hemorrhage. Possible hemorrhage at different times. The hematoma measures 7.0 x 4.8 x 7.0 cm, corresponding to 117 mL of blood. There is hemorrhage extension into the ventricles which are nondilated. There is also subarachnoid hemorrhage over the convexity bilaterally right greater than left. There is 10 mm midline shift to the right due to mass-effect. No evidence of underlying mass or edema. Vascular: Negative for hyperdense vessel Skull: Negative Sinuses/Orbits: Mild mucosal edema paranasal sinuses. Normal orbit. Other:  None CT CERVICAL SPINE FINDINGS Alignment: Normal Skull base and vertebrae: Negative for fracture or mass Soft tissues and spinal canal: No soft tissue swelling or mass. Patient intubated. Disc levels:  Lung apices clear bilaterally. Upper chest: Negative Other: None IMPRESSION: 1. Large area of acute hemorrhage in the left parietal lobe with hemorrhage extension into the ventricles. Hematoma volume 117 mL. 10 mm midline shift to the right due to mass-effect. Subarachnoid hemorrhage over the convexity bilaterally right greater than left. Differential diagnosis includes amyloid angiopathy, vascular malformation, hypertension, coagulopathy. 2. Negative for cervical spine fracture. 3. These results were called by telephone at the time of interpretation on 11/08/2019 at 11:40 am to provider [facility] , who verbally acknowledged these results. Electronically Signed   By: Franchot Gallo M.D.   On: 11/08/2019 11:48   Ct Chest Wo Contrast  Result Date: 11/08/2019 CLINICAL DATA:  Found face down at home. EXAM: CT CHEST, ABDOMEN AND PELVIS WITHOUT CONTRAST TECHNIQUE: Multidetector CT imaging of the chest, abdomen and pelvis was performed  following the standard protocol without IV contrast. COMPARISON:  None. FINDINGS: CT CHEST FINDINGS Cardiovascular: The heart is normal in size. No pericardial effusion. There is mild tortuosity and calcification of the thoracic aorta. Moderate to advanced three-vessel coronary artery calcifications. Mediastinum/Nodes: No mediastinal or hilar mass or adenopathy. The endotracheal tube is in good position at the mid tracheal level. There is a moderate to large hiatal hernia noted. Lungs/Pleura: No acute pulmonary findings. Minimal dependent atelectasis. No infiltrates or effusions. No pneumothorax. No pulmonary contusion. Musculoskeletal: The bony thorax is intact. No sternal or rib fractures are identified. Remote healed left-sided rib fractures are noted. The vertebral bodies are normally aligned. No definite acute thoracic spine fractures. CT ABDOMEN PELVIS FINDINGS Hepatobiliary: No focal hepatic lesions or acute hepatic injury. The gallbladder appears normal. No common bile duct dilatation. Pancreas: No mass, inflammation or ductal dilatation. Spleen: Normal size.  No focal lesions. Adrenals/Urinary Tract: Adrenal glands and kidneys are grossly normal without contrast. No worrisome renal lesions. No renal or obstructing ureteral calculi. No bladder calculi. There is a Foley catheter in the bladder. Stomach/Bowel: The stomach, duodenum, small bowel and colon are grossly normal without oral contrast. No inflammatory changes, mass lesions or obstructive findings. The terminal ileum and appendix are normal. Vascular/Lymphatic: Moderate to advanced atherosclerotic calcifications involving the distal aorta and iliac arteries but no aneurysm. No mesenteric or retroperitoneal mass, adenopathy or hematoma. Reproductive: Surgically absent. Other: No pelvic mass or adenopathy. No free pelvic fluid collections. No inguinal mass or adenopathy. No abdominal wall hernia or subcutaneous lesions. Musculoskeletal: The lumbar  vertebral bodies are  normally aligned. No acute fracture. The bony pelvis is intact. The pubic symphysis and SI joints are intact. Both hips are normally located. IMPRESSION: 1. No acute cardiopulmonary findings are identified without contrast and the bony thorax is intact. 2. Moderate-sized hiatal hernia. 3. Three-vessel coronary artery calcifications. 4. No acute abdominal/pelvic findings. The solid abdominal organs are intact and no secondary signs for a bowel injury. 5. Intact bony pelvis. Electronically Signed   By: Marijo Sanes M.D.   On: 11/08/2019 12:21   Ct Cervical Spine Wo Contrast  Result Date: 11/08/2019 CLINICAL DATA:  Altered consciousness. EXAM: CT HEAD WITHOUT CONTRAST CT CERVICAL SPINE WITHOUT CONTRAST TECHNIQUE: Multidetector CT imaging of the head and cervical spine was performed following the standard protocol without intravenous contrast. Multiplanar CT image reconstructions of the cervical spine were also generated. COMPARISON:  None. FINDINGS: CT HEAD FINDINGS Brain: Large area of acute hemorrhage in the left parietal lobe. Hyperdense blood is present throughout most of the hematoma. Inferiorly there is an area of slightly decreased density of acute or subacute hemorrhage. Possible hemorrhage at different times. The hematoma measures 7.0 x 4.8 x 7.0 cm, corresponding to 117 mL of blood. There is hemorrhage extension into the ventricles which are nondilated. There is also subarachnoid hemorrhage over the convexity bilaterally right greater than left. There is 10 mm midline shift to the right due to mass-effect. No evidence of underlying mass or edema. Vascular: Negative for hyperdense vessel Skull: Negative Sinuses/Orbits: Mild mucosal edema paranasal sinuses. Normal orbit. Other:  None CT CERVICAL SPINE FINDINGS Alignment: Normal Skull base and vertebrae: Negative for fracture or mass Soft tissues and spinal canal: No soft tissue swelling or mass. Patient intubated. Disc levels:  Lung  apices clear bilaterally. Upper chest: Negative Other: None IMPRESSION: 1. Large area of acute hemorrhage in the left parietal lobe with hemorrhage extension into the ventricles. Hematoma volume 117 mL. 10 mm midline shift to the right due to mass-effect. Subarachnoid hemorrhage over the convexity bilaterally right greater than left. Differential diagnosis includes amyloid angiopathy, vascular malformation, hypertension, coagulopathy. 2. Negative for cervical spine fracture. 3. These results were called by telephone at the time of interpretation on 11/08/2019 at 11:40 am to provider [facility] , who verbally acknowledged these results. Electronically Signed   By: Franchot Gallo M.D.   On: 11/08/2019 11:48   Ct T-spine No Charge  Result Date: 11/08/2019 CLINICAL DATA:  Patient found down and unresponsive today. Initial encounter. EXAM: CT THORACIC AND LUMBAR SPINE WITHOUT CONTRAST TECHNIQUE: Multidetector CT imaging of the thoracic and lumbar spine was performed without contrast. Multiplanar CT image reconstructions were also generated. COMPARISON:  None. FINDINGS: CT THORACIC SPINE FINDINGS Alignment: Normal. Vertebrae: No acute fracture or focal pathologic process. Scattered small Schmorl's nodes are seen, most prominent in the superior endplates of T3 and T4. Paraspinal and other soft tissues: See report of dedicated chest, abdomen and pelvis CT scan today. Disc levels: Intervertebral disc space height is maintained. CT LUMBAR SPINE FINDINGS Segmentation: Standard. Alignment: Trace anterolisthesis L4 on L5 is due to facet arthropathy. Vertebrae: No acute fracture or focal pathologic process. Paraspinal and other soft tissues: See report of dedicated chest, abdomen and pelvis CT scan today. Disc levels: Intervertebral disc space height is maintained. Advanced bilateral facet arthropathy is present at L4-5 and there is right worse than left facet degenerative disease at L5-S1. IMPRESSION: CT THORACIC SPINE  IMPRESSION Negative exam.  No acute abnormality. CT LUMBAR SPINE IMPRESSION No acute abnormality. Lower lumbar facet  degenerative disease. Electronically Signed   By: Inge Rise M.D.   On: 11/08/2019 12:17   Ct L-spine No Charge  Result Date: 11/08/2019 CLINICAL DATA:  Found down at home. EXAM: CT LUMBAR SPINE WITHOUT CONTRAST TECHNIQUE: Multidetector CT imaging of the lumbar spine was performed without intravenous contrast administration. Multiplanar CT image reconstructions were also generated. COMPARISON:  None. FINDINGS: Segmentation: There are five lumbar type vertebral bodies. The last full intervertebral disc space is labeled L5-S1. Alignment: Normal Vertebrae: Osteoporosis but no acute fracture or bone lesion. The facets are normally aligned. Moderate facet disease but no pars defects or pars fractures. Paraspinal and other soft tissues: No significant paraspinal or retroperitoneal findings. Disc levels: No large disc protrusions, spinal or foraminal stenosis. IMPRESSION: Normal alignment and no acute bony findings. No large disc protrusions or significant spinal or foraminal stenosis. Electronically Signed   By: Marijo Sanes M.D.   On: 11/08/2019 12:23     Assessment/Plan: 73 year old female found down.  Presented with GCS of 3.  Now intubated.  Head CT shows left parietal ICH with 52mm midline shift, subarachnoid component and intraventricular extension.  No evidence of aneurysm on CTA.  Patient hypertensive.  No evidence of coagulopathy.  Recommendations: 1. Transfer to facility with neurosurgical coverage 2.  Patient started on Mannitol.  Would employ hypertonic saline as well if evidence of herniation develops 3. BP control with SBP<160.   4. Frequent neuro checks    Alexis Goodell, MD Neurology [phone] 11/08/2019, 12:25 PM

## 2019-11-08 NOTE — ED Triage Notes (Signed)
Pt via EMS from home. Pt was found face-down and unresponsive at her home. Per daughter Dagoberto Reef was 0900 last night. Pt briefly loss pulses en route. Pt respirations via BVM on arrivals. Pulses present on arrival.

## 2019-11-08 NOTE — ED Provider Notes (Signed)
[facility] Emergency Department Provider Note  ____________________________________________   First MD Initiated Contact with Patient 11/08/19 1040     (approximate)  I have reviewed the triage vital signs and the nursing notes.   HISTORY  Chief Complaint Respiratory Distress and Altered Mental Status    HPI Alison Lyons is a 73 y.o. female unknown medical history who presents for unresponsiveness.  Patient family had not heard from her since yesterday so they had a wellness check on her.  Patient was found down laying on the ground face down, with agonal breathing.  Sugar was normal.  Patient was given Narcan with minimal to no response.  Patient was being bagged due to shallow respirations with poor end-tidal.     Unable to get full HPI due to altered mental status.   Unable to get past medical history past surgical history, allergies secondary to altered mental status.   Review of Systems Unable to get review of systems due to altered mental status. ____________________________________________   PHYSICAL EXAM:  VITAL SIGNS: Blood pressure 138/82, pulse 98, temperature (!) 96.1 F (35.6 C), temperature source Bladder, resp. rate (!) 29, height 5\' 6"  (1.676 m), weight 68 kg, SpO2 99 %.  Constitutional: Agonal breathing, unresponsive Eyes: Conjunctivae are normal.  Pupils 2 reactive bilaterally Head: Atraumatic. Nose: No congestion/rhinnorhea. Mouth/Throat: Mucous membranes are moist.   Neck: No stridor. Trachea Midline. FROM Cardiovascular: Normal rate, regular rhythm. Grossly normal heart sounds.  Good peripheral circulation. Respiratory: Normal respiratory effort.  No retractions.  Coarse breath sounds with bagging. Gastrointestinal: Soft and nontender. No distention.  No obvious trauma Musculoskeletal: No lower extremity tenderness nor edema.  No joint effusions. Neurologic: GCS is 3.  Agonal breathing. Skin:  Skin is warm, dry and  intact. No rash noted. Psychiatric: Unable to fully assess due to altered mental status. GU: Deferred   ____________________________________________   LABS (all labs ordered are listed, but only abnormal results are displayed)  Labs Reviewed  SARS CORONAVIRUS 2 BY RT PCR ([place] [facility])  CBC WITH DIFFERENTIAL/PLATELET  COMPREHENSIVE METABOLIC PANEL  LIPASE, BLOOD  CK  URINE DRUG SCREEN, QUALITATIVE ([facility] ONLY)  URINALYSIS, ROUTINE W REFLEX MICROSCOPIC  ETHANOL  MAGNESIUM  TSH  T4, FREE  PROTIME-INR  APTT  TYPE AND SCREEN  TROPONIN I (HIGH SENSITIVITY)   ____________________________________________   ED ECG REPORT I, Vanessa [place], the attending physician, personally viewed and interpreted this ECG.  EKG is normal sinus rate of 63, no ST elevation, no T wave inversions, normal intervals ____________________________________________  RADIOLOGY Robert Bellow, personally viewed and evaluated these images (plain radiographs) as part of my medical decision making, as well as reviewing the written report by the radiologist.  ED MD interpretation: Chest x-ray shows ET tube in good place  Official radiology report(s): Dg Chest 1 View  Result Date: 11/08/2019 CLINICAL DATA:  Patient found down and unresponsive today. EXAM: CHEST  1 VIEW COMPARISON:  None. FINDINGS: Endotracheal tube is in place with the tip in good position 3.6 cm above the carina. Defibrillator pads noted. Lungs are clear. Heart size is normal. There is aortic atherosclerosis. No pneumothorax or pleural fluid. No acute or focal bony abnormality. IMPRESSION: ETT in good position. Lungs clear. Atherosclerosis. Electronically Signed   By: Inge Rise M.D.   On: 11/08/2019 11:18    ____________________________________________   PROCEDURES  Procedure(s) performed (including Critical Care):  .Critical Care Performed by: Vanessa [place],  MD Authorized by: Vanessa [place], MD   Critical care provider statement:    Critical care time (minutes):  45   Critical care was necessary to treat or prevent imminent or life-threatening deterioration of the following conditions:  CNS failure or compromise   Critical care was time spent personally by me on the following activities:  Discussions with consultants, evaluation of patient's response to treatment, examination of patient, ordering and performing treatments and interventions, ordering and review of laboratory studies, ordering and review of radiographic studies, pulse oximetry, re-evaluation of patient's condition, obtaining history from patient or surrogate and review of old charts Procedure Name: Intubation Date/Time: 11/08/2019 3:43 PM Performed by: Vanessa [place], MD Pre-anesthesia Checklist: Patient identified Ventilation: Mask ventilation without difficulty Laryngoscope Size: 3 and Glidescope Grade View: Grade I Tube size: 7.5 mm Number of attempts: 1 Airway Equipment and Method: Rigid stylet Placement Confirmation: ETT inserted through vocal cords under direct vision Dental Injury: Teeth and Oropharynx as per pre-operative assessment  Difficulty Due To: Difficult Airway- due to reduced neck mobility        ____________________________________________   INITIAL IMPRESSION / ASSESSMENT AND PLAN / ED COURSE  Alison Lyons was evaluated in Emergency Department on 11/08/2019 for the symptoms described in the history of present illness. She was evaluated in the context of the global COVID-19 pandemic, which necessitated consideration that the patient might be at risk for infection with the SARS-CoV-2 virus that causes COVID-19. Institutional protocols and algorithms that pertain to the evaluation of patients at risk for COVID-19 are in a state of rapid change based on information released by regulatory bodies including the CDC and federal and state organizations. These policies and algorithms were  followed during the patient's care in the ED.    Patient is a unknown elderly female who presents with altered mental status agonal breathing in the setting of being found down on the ground.  Sugar was normal.  Patient did not respond to Narcan.  Will intubate patient for airway protection given GCS of 3.  Inline immobilization was done due to the concern that patient was found down.  CT head will be done to evaluate for intracranial hemorrhage.  Will get CT pan scan to evaluate for other signs of trauma both abdominally, chest, fractures.  Will get labs to evaluate for ACS, Electra abnormalities, AKI.  Patient's CT scan consistent with large intracranial hemorrhage of the parietal lobe.  Also some concern for subarachnoid.  Will get CTA to evaluate for aneurysm.  Will control blood pressure with nicardipine.  Due to the edema will give  Mannitol, elevated HOB, hyperventilate.  Discussed with our neurologist Dr. Doy Mince who recommended transfer to [facility].  Discussed with [facility] at [facility] Dr. Arnoldo Morale who recommend we discuss with [facility] for transfer since [facility] typically takes call for [place]. No on call [facility] today though which I informed him of.  12:44 PM D/w Dr. Brigitte Pulse from [facility] neuro ICU tentatively accepted to bed but may be 1-2 hours. Will d/w [facility].   D/w Dr. Talbot Grumbling and accepted for transfer to [facility] .  Patient's CTA was negative for aneurysm.  Patient is on nicardipine drip to help facilitate getting blood pressures less than 140.  Patient was only on aspirin and Plavix so no other anticoagulation reversal necessary.  Patient was transferred to [facility] by air flight given there was not coming to be any ground transportation for multiple hours.   ____________________________________________   FINAL  CLINICAL IMPRESSION(S) / ED DIAGNOSES   Final diagnoses:  Trauma  Intracranial hemorrhage ([place])      MEDICATIONS GIVEN DURING THIS VISIT:  Medications  etomidate (AMIDATE) injection (20 mg  Intravenous Given 11/08/19 1037)  rocuronium (ZEMURON) injection (100 mg Intravenous Given 11/08/19 1037)  fentaNYL 2566mcg in NS 237mL (2mcg/ml) infusion-PREMIX (25 mcg/hr Intravenous New Bag/Given 11/08/19 1418)  propofol (DIPRIVAN) 1000 MG/100ML infusion (0 mcg/kg/min Intravenous Stopped 11/08/19 1222)  nicardipine (CARDENE) 20mg  in 0.86% saline 215ml IV infusion (0.1 mg/ml) (0 mg/hr Intravenous Stopped 11/08/19 1408)  iohexol (OMNIPAQUE) 350 MG/ML injection 75 mL (75 mLs Intravenous Contrast Given 11/08/19 1225)  mannitol 25 % IVPB 75 g (0 g Intravenous Stopped 11/08/19 1442)     ED Discharge Orders    None       Note:  This document was prepared using Dragon voice recognition software and may include unintentional dictation errors.   Vanessa [place], MD 11/08/19 [phone]

## 2019-11-08 NOTE — Progress Notes (Signed)
Pt was transported to CT and back to ED x2 while on the vent.

## 2019-12-06 DEATH — deceased

## 2020-06-19 IMAGING — CT CT L SPINE W/O CM
3 series · 12 of 33 positions shown, 14 images · non-contrast
Comparison: None.

CLINICAL DATA: Found down at home.

EXAM:
CT LUMBAR SPINE WITHOUT CONTRAST
TECHNIQUE: Multidetector CT imaging of the lumbar spine was performed without
intravenous contrast administration. Multiplanar CT image
reconstructions were also generated.

[Series 1: l-spine axial bone · axial · 0.26mm/px · z∈[-558,-334]mm · 4 of 162 slices shown, 5 images]
[im 25/162  soft-tissue]
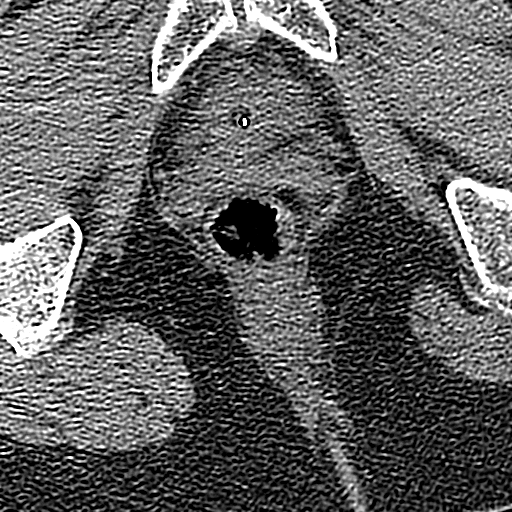
[im 25/162  bone]
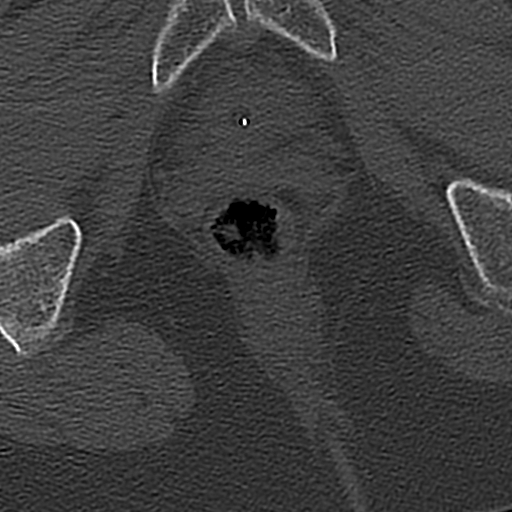
[im 62/162  bone]
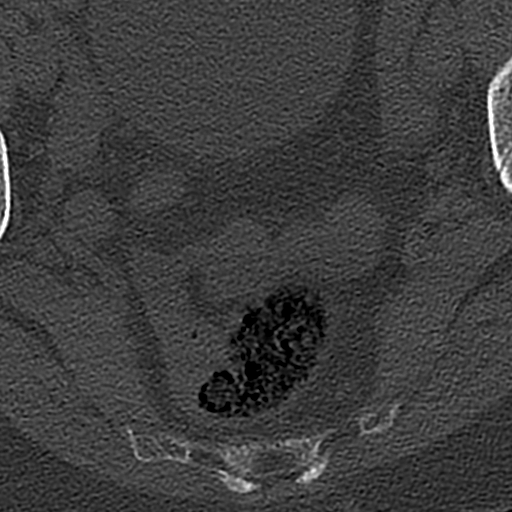
[im 100/162  bone]
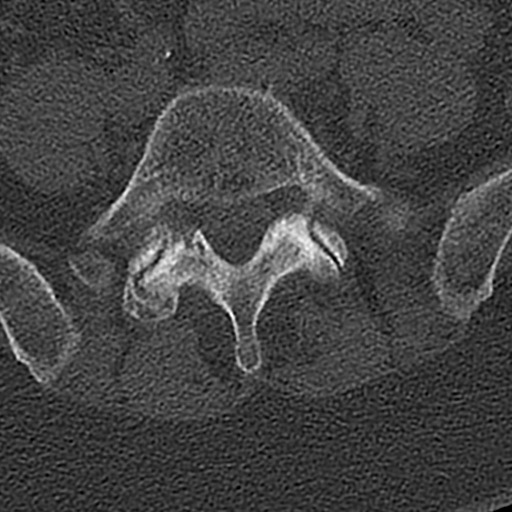
[im 137/162  bone]
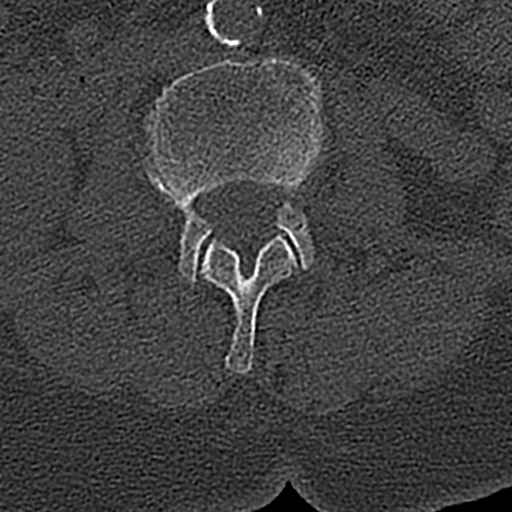

[Series 3: l-spine coronal bone · coronal · 0.34mm/px · 3 of 76 slices shown]
[im 16/76  bone]
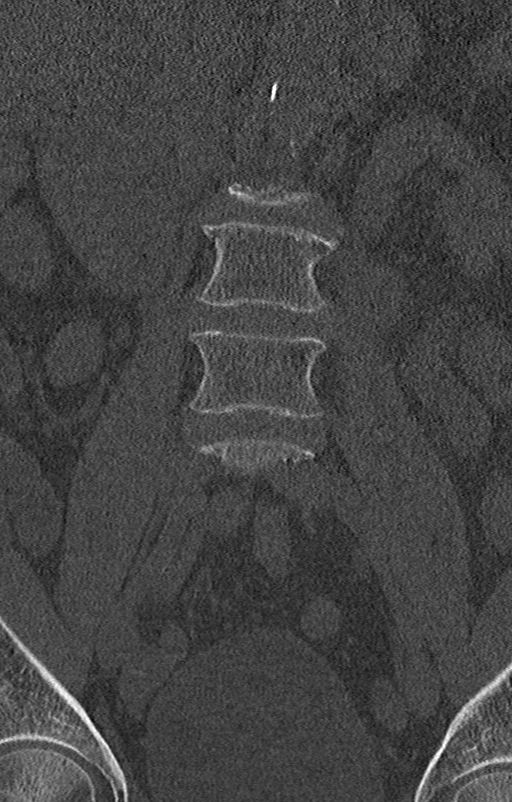
[im 31/76  bone]
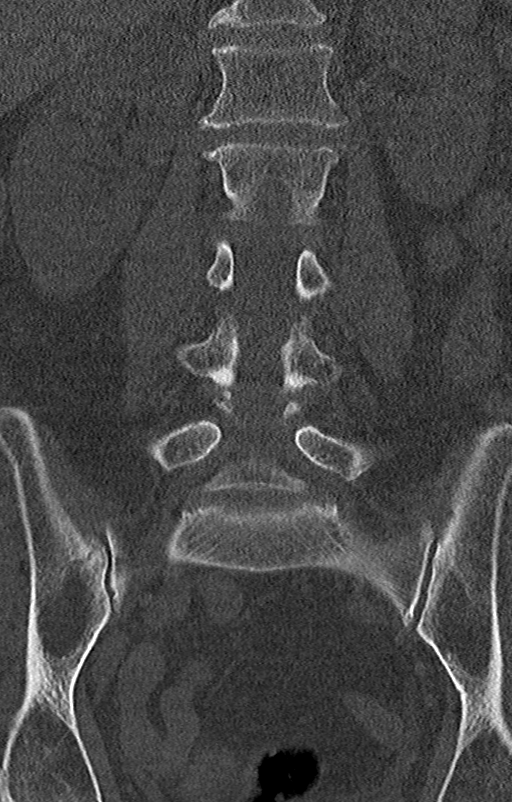
[im 46/76  bone]
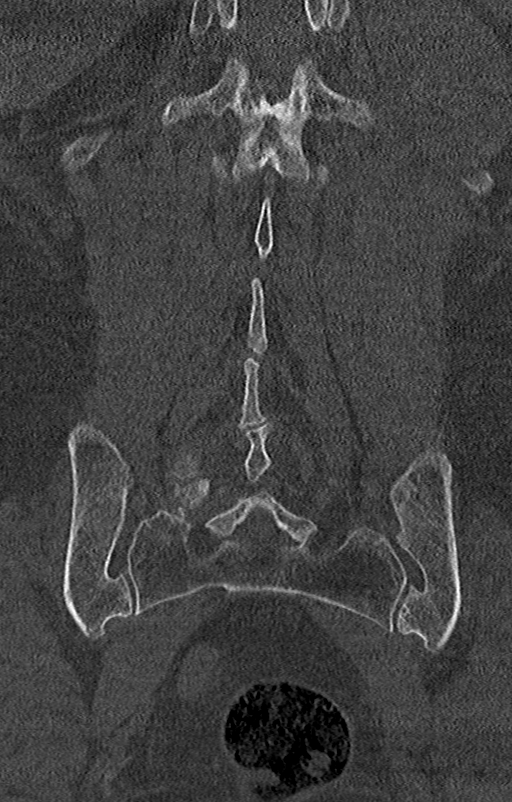

[Series 4: l-spine sag bone · sagittal · 0.29mm/px · 5 of 62 slices shown, 6 images]
[im 21/62  bone]
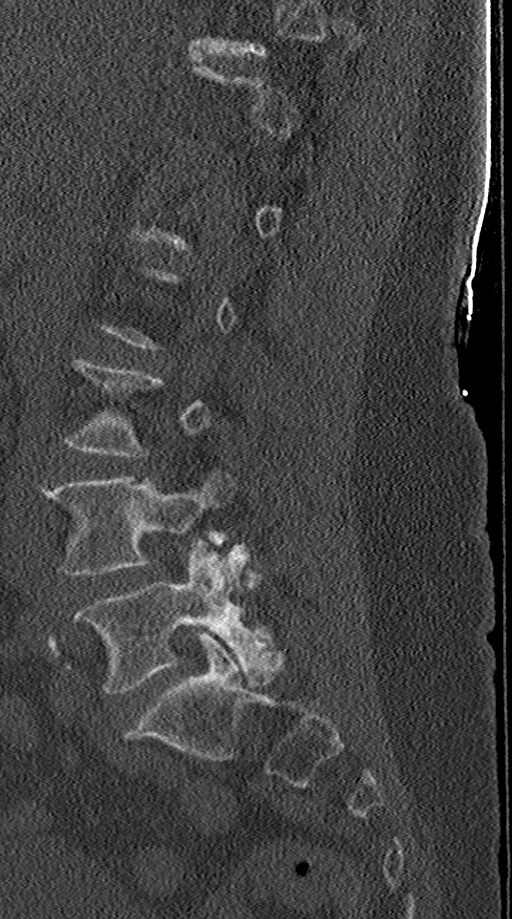
[im 26/62  bone]
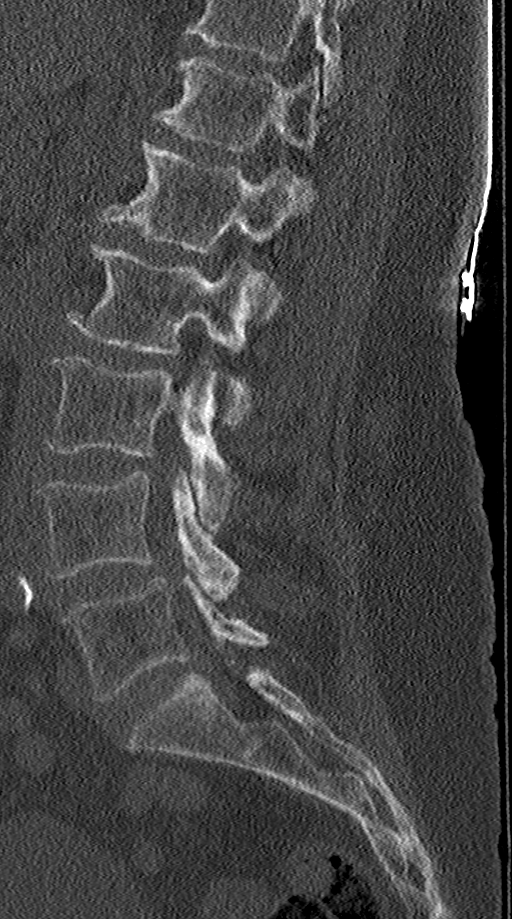
[im 31/62  soft-tissue]
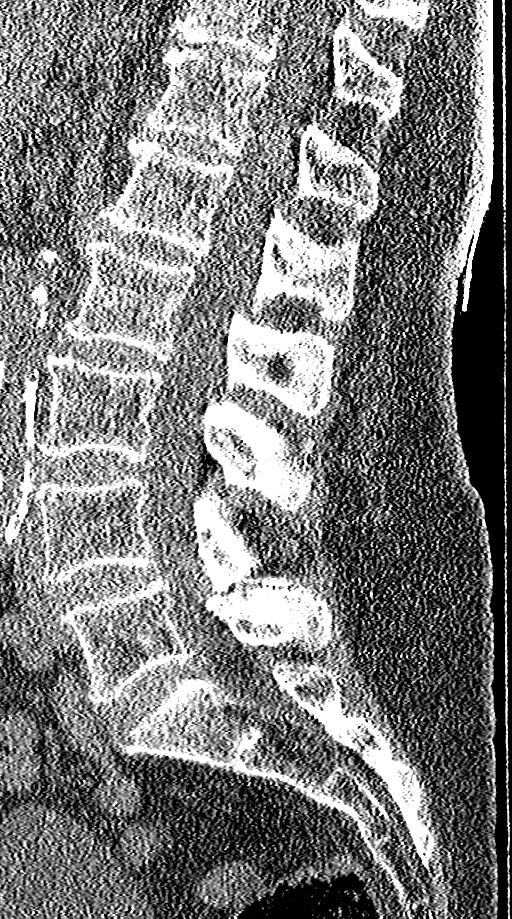
[im 31/62  bone]
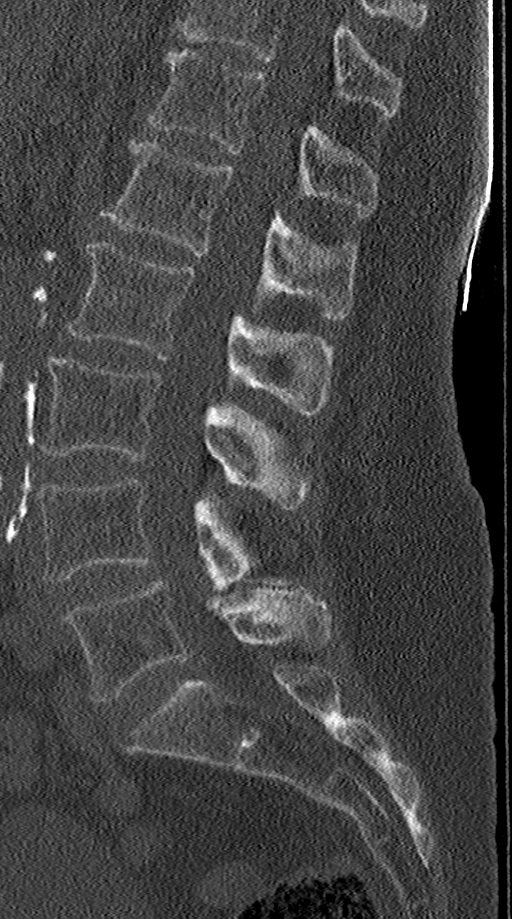
[im 36/62  bone]
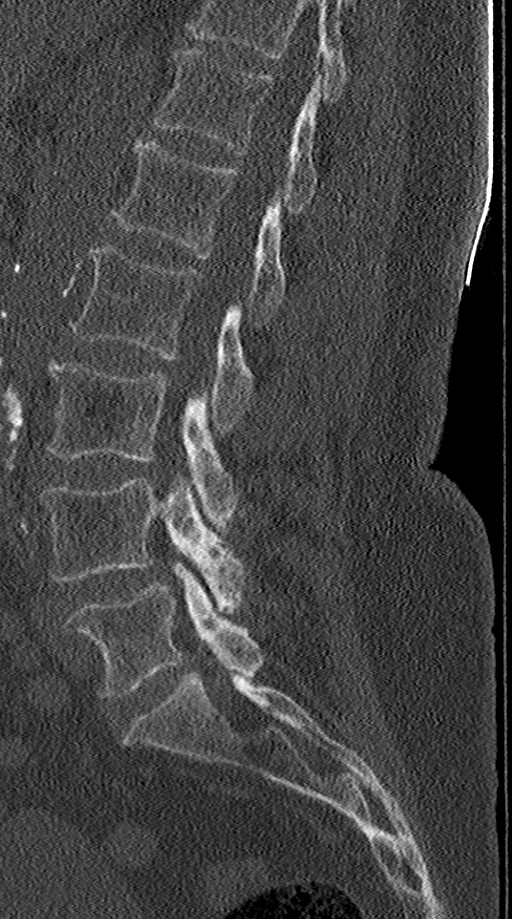
[im 41/62  bone]
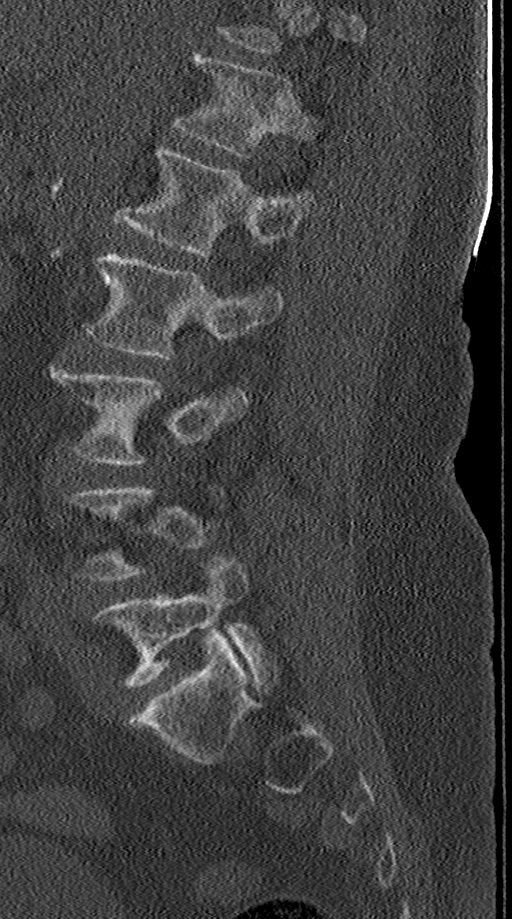

[12 of 33 positions shown; findings below may reference images not displayed]

FINDINGS: Segmentation: There are five lumbar type vertebral bodies. The last
full intervertebral disc space is labeled L5-S1.

Alignment: Normal

Vertebrae: Osteoporosis but no acute fracture or bone lesion. The
facets are normally aligned. Moderate facet disease but no pars
defects or pars fractures.

Paraspinal and other soft tissues: No significant paraspinal or
retroperitoneal findings.

Disc levels: No large disc protrusions, spinal or foraminal
stenosis.
IMPRESSION: Normal alignment and no acute bony findings.

No large disc protrusions or significant spinal or foraminal
stenosis.
